# Patient Record
Sex: Female | Born: 1945 | Race: White | Hispanic: No | Marital: Married | Smoking: Former smoker
Health system: Southern US, Community
[De-identification: ages and names within clinical notes are randomized; demographics above are authoritative.]

## PROBLEM LIST (undated history)

## (undated) DIAGNOSIS — E079 Disorder of thyroid, unspecified: Secondary | ICD-10-CM

## (undated) DIAGNOSIS — I10 Essential (primary) hypertension: Secondary | ICD-10-CM

## (undated) DIAGNOSIS — E876 Hypokalemia: Secondary | ICD-10-CM

## (undated) DIAGNOSIS — C801 Malignant (primary) neoplasm, unspecified: Secondary | ICD-10-CM

## (undated) HISTORY — PX: HIP SURGERY: SHX245

## (undated) HISTORY — PX: CATARACT EXTRACTION: SUR2

---

## 2005-06-11 ENCOUNTER — Emergency Department: Payer: Self-pay | Admitting: Emergency Medicine

## 2005-06-14 ENCOUNTER — Emergency Department: Payer: Self-pay | Admitting: Emergency Medicine

## 2007-02-15 ENCOUNTER — Emergency Department: Payer: Self-pay | Admitting: Emergency Medicine

## 2011-01-16 ENCOUNTER — Emergency Department: Payer: Self-pay | Admitting: *Deleted

## 2013-03-21 ENCOUNTER — Emergency Department: Payer: Self-pay | Admitting: Emergency Medicine

## 2014-01-16 ENCOUNTER — Emergency Department: Payer: Self-pay | Admitting: Emergency Medicine

## 2016-08-07 ENCOUNTER — Emergency Department: Payer: Medicare HMO

## 2016-08-07 ENCOUNTER — Emergency Department
Admission: EM | Admit: 2016-08-07 | Discharge: 2016-08-07 | Disposition: A | Discharge status: Home / Self Care | Payer: Medicare HMO | Attending: Emergency Medicine | Admitting: Emergency Medicine

## 2016-08-07 DIAGNOSIS — Z853 Personal history of malignant neoplasm of breast: Secondary | ICD-10-CM | POA: Insufficient documentation

## 2016-08-07 DIAGNOSIS — R519 Headache, unspecified: Secondary | ICD-10-CM

## 2016-08-07 DIAGNOSIS — Z87891 Personal history of nicotine dependence: Secondary | ICD-10-CM | POA: Insufficient documentation

## 2016-08-07 DIAGNOSIS — R51 Headache: Secondary | ICD-10-CM | POA: Insufficient documentation

## 2016-08-07 DIAGNOSIS — I10 Essential (primary) hypertension: Secondary | ICD-10-CM | POA: Insufficient documentation

## 2016-08-07 DIAGNOSIS — H5712 Ocular pain, left eye: Secondary | ICD-10-CM | POA: Insufficient documentation

## 2016-08-07 DIAGNOSIS — J3489 Other specified disorders of nose and nasal sinuses: Secondary | ICD-10-CM

## 2016-08-07 HISTORY — DX: Malignant (primary) neoplasm, unspecified: C80.1

## 2016-08-07 HISTORY — DX: Hypokalemia: E87.6

## 2016-08-07 HISTORY — DX: Essential (primary) hypertension: I10

## 2016-08-07 HISTORY — DX: Disorder of thyroid, unspecified: E07.9

## 2016-08-07 LAB — CBC
HEMATOCRIT: 34.9 % — AB (ref 35.0–47.0)
HEMOGLOBIN: 12.1 g/dL (ref 12.0–16.0)
MCH: 29.7 pg (ref 26.0–34.0)
MCHC: 34.7 g/dL (ref 32.0–36.0)
MCV: 85.7 fL (ref 80.0–100.0)
Platelets: 290 10*3/uL (ref 150–440)
RBC: 4.08 MIL/uL (ref 3.80–5.20)
RDW: 12.8 % (ref 11.5–14.5)
WBC: 12.1 10*3/uL — ABNORMAL HIGH (ref 3.6–11.0)

## 2016-08-07 LAB — BASIC METABOLIC PANEL WITH GFR
Anion gap: 10 (ref 5–15)
BUN: 8 mg/dL (ref 6–20)
CO2: 26 mmol/L (ref 22–32)
Calcium: 8.8 mg/dL — ABNORMAL LOW (ref 8.9–10.3)
Chloride: 95 mmol/L — ABNORMAL LOW (ref 101–111)
Creatinine, Ser: 0.65 mg/dL (ref 0.44–1.00)
GFR calc Af Amer: >60 mL/min
GFR calc non Af Amer: >60 mL/min
Glucose, Bld: 151 mg/dL — ABNORMAL HIGH (ref 65–99)
Potassium: 3.3 mmol/L — ABNORMAL LOW (ref 3.5–5.1)
Sodium: 131 mmol/L — ABNORMAL LOW (ref 135–145)

## 2016-08-07 NOTE — ED Triage Notes (Signed)
Pt states she had cataract surgery of the left eye 2/5 and since the day of surgery shs has had left sided HA  With pain radiating into the neck and jaw.

## 2016-08-07 NOTE — Discharge Instructions (Signed)
You have been seen in the Emergency Department (ED) for a headache.  Please use Tylenol or Motrin as needed for symptoms, but only as written on the box.   As we have discussed, please follow up with your primary care doctor as soon as possible regarding today?s Emergency Department (ED) visit and your headache symptoms.  We also encourage you to go to your eye doctor at the next available opportunity for follow up.  Call your doctor or return to the ED if you have a worsening headache, sudden and severe headache, confusion, slurred speech, facial droop, weakness or numbness in any arm or leg, extreme fatigue, vision problems, or other symptoms that concern you.

## 2016-08-07 NOTE — ED Provider Notes (Signed)
[facility] Emergency Department Provider Note  ____________________________________________   First MD Initiated Contact with Patient 08/07/16 1614     (approximate)  I have reviewed the triage vital signs and the nursing notes.   HISTORY  Chief Complaint Headache    HPI Cindy Moss is a 71 y.o. female who presents with a persistent left-sided headache and bilateral frontal facial pain and cheek pain.  It started after she had L eye (cataract) surgery about 3 weeks ago.  She says the pain has been persistent although it is feeling better now.  It used to involve the back of her head and now it only involves the front and the left side.  She says that her eyelid looks much better than it did before.  She stopped using some of the drops she was given by her ophthalmologist because she wondered if maybe that was making it worse.  She statesthat she has been to 3 or 4 other doctors including her PCP since the surgery but no one can tell her why her head is hurting.  She says she has trouble sleeping at night because the pain but nothing in particular makes it better nor worse.  She denies nausea, vomiting, any acute visual changes, neck pain, difficulty swallowing, difficulty breathing, chest pain, abdominal pain.  He describes the pain as a dull throbbing in the front of her face, around her left eye, and the side of her head.   Past Medical History:  Diagnosis Date  . Cancer (HCC)    breast  . Hypertension   . Hypokalemia   . Thyroid disease     There are no active problems to display for this patient.   Past Surgical History:  Procedure Laterality Date  . CATARACT EXTRACTION Left   . HIP SURGERY Left     Prior to Admission medications   Not on File    Allergies Amlodipine; Desipramine; Flavoxate; Influenza vaccines; Metronidazole; Penicillins; Xanax [alprazolam]; and Ace inhibitors  No family history on file.  Social History Social  History  Substance Use Topics  . Smoking status: Former Research scientist (life sciences)  . Smokeless tobacco: Never Used  . Alcohol use No    Review of Systems Constitutional: No fever/chills Eyes: No acute visual changes. ENT: No sore throat. Cardiovascular: Denies chest pain. Respiratory: Denies shortness of breath. Gastrointestinal: No abdominal pain.  No nausea, no vomiting.  No diarrhea.  No constipation. Genitourinary: Negative for dysuria. Musculoskeletal: Negative for back pain. Skin: Negative for rash. Neurological: Negative for headaches, focal weakness or numbness.  10-point ROS otherwise negative.  ____________________________________________   PHYSICAL EXAM:  VITAL SIGNS: ED Triage Vitals  Enc Vitals Group     BP 08/07/16 1449 (!) 151/72     Pulse Rate 08/07/16 1449 (!) 102     Resp 08/07/16 1449 18     Temp 08/07/16 1449 98.8 F (37.1 C)     Temp Source 08/07/16 1449 Oral     SpO2 08/07/16 1449 99 %     Weight 08/07/16 1450 119 lb (54 kg)     Height 08/07/16 1450 5\' 4"  (1.626 m)     Head Circumference --      Peak Flow --      Pain Score 08/07/16 1503 7     Pain Loc --      Pain Edu? --      Excl. in [place]? --     Constitutional: Alert and oriented. Well appearing and in no  acute distress. Eyes: Left eye gazes to the side and up at baseline, but there is no evidence of any acute infection, trauma, nor other acute abnormality.  She has no periorbital tenderness to palpation and no swelling nor erythema. Head: Atraumatic.  No temporal tenderness to palpation.  She has some mild tenderness to palpation of her frontal sinuses bilaterally and her maxillary sinuses bilaterally. Nose: No congestion/rhinnorhea. Mouth/Throat: Mucous membranes are moist. Neck: No stridor.  No meningeal signs.   Cardiovascular: Normal rate, regular rhythm. Good peripheral circulation. Grossly normal heart sounds. Respiratory: Normal respiratory effort.  No retractions. Lungs CTAB. Gastrointestinal: Soft  and nontender. No distention.  Musculoskeletal: No lower extremity tenderness nor edema. No gross deformities of extremities. Neurologic:  Normal speech and language. No gross focal neurologic deficits are appreciated.  Skin:  Skin is warm, dry and intact. No rash noted. Psychiatric: Mood and affect are normal. Speech and behavior are normal.  ____________________________________________   LABS (all labs ordered are listed, but only abnormal results are displayed)  Labs Reviewed  BASIC METABOLIC PANEL - Abnormal; Notable for the following:       Result Value   Sodium 131 (*)    Potassium 3.3 (*)    Chloride 95 (*)    Glucose, Bld 151 (*)    Calcium 8.8 (*)    All other components within normal limits  CBC - Abnormal; Notable for the following:    WBC 12.1 (*)    HCT 34.9 (*)    All other components within normal limits   ____________________________________________  EKG  None - EKG not ordered by ED physician ____________________________________________  RADIOLOGY   Ct Maxillofacial Wo Contrast  Result Date: 08/07/2016 CLINICAL DATA:  Recent cataract surgery. Patient complains sinus pain and left eye pain. EXAM: CT MAXILLOFACIAL WITHOUT CONTRAST TECHNIQUE: Multidetector CT imaging of the maxillofacial structures was performed. Multiplanar CT image reconstructions were also generated. A small metallic BB was placed on the right temple in order to reliably differentiate right from left. COMPARISON:  None. FINDINGS: Osseous: Mandible is intact. Mandibular condyles are located. The patient is edentulous. No fracture. Orbits: Negative. No traumatic or inflammatory finding. Sinuses: Tiny polyp or mucosal disease in the right maxillary sinus. Mucosal disease in the left posterior ethmoid air cells. Soft tissues: No significant soft tissue swelling. No significant lymphadenopathy in the upper neck or facial regions. Limited intracranial: No significant or unexpected finding. IMPRESSION:  No acute abnormality in the face. Minimal sinus disease. Electronically Signed   By: Markus Daft M.D.   On: 08/07/2016 16:55    ____________________________________________   PROCEDURES  Procedure(s) performed:   Procedures   Critical Care performed: No ____________________________________________   INITIAL IMPRESSION / ASSESSMENT AND PLAN / ED COURSE  Pertinent labs & imaging results that were available during my care of the patient were reviewed by me and considered in my medical decision making (see chart for details).  The patient has an odd affect but is in no acute distress.  I do not see any signs or symptoms of acute infection.  Temporal arteritis is extremely unlikely given that she has no temporal tenderness to palpation.  She does have some tenderness to palpation of her frontal maxillary sinuses.  There is no evidence of preseptal nor orbital cellulitis nor any other infectious process.  I will evaluate with a noncontrast maxillofacial CT scan (CT Sinuses) filled for any acute abnormality, but I explained to the patient that it is most likely she will need  to follow up with her eye doctor.  She understands and agrees with this plan.      ____________________________________________  FINAL CLINICAL IMPRESSION(S) / ED DIAGNOSES  Final diagnoses:  Sinus pain  Left eye pain  Nonintractable headache, unspecified chronicity pattern, unspecified headache type     MEDICATIONS GIVEN DURING THIS VISIT:  Medications - No data to display   NEW OUTPATIENT MEDICATIONS STARTED DURING THIS VISIT:  There are no discharge medications for this patient.   There are no discharge medications for this patient.   There are no discharge medications for this patient.    Note:  This document was prepared using Dragon voice recognition software and may include unintentional dictation errors.    Hinda Kehr, MD 08/07/16 2003

## 2016-08-07 NOTE — ED Notes (Addendum)
Pt c/o bilateral h/a that radiates to neck "a few days" after L eye surgery.  Pt sts that she has seen her PCP and 4 other doctors for same, but "they weren't any good". Pt sts that she was prescribed 3 different eye drops after surgery, but stopped using one after one of her doctors said she was having and allergic reaction.  Pt c/o inability to sleep and lack of appetite r/t pain.  Pt alert, ambulatory w/o issue, resp even and unlabored.  PERRLA

## 2016-08-07 NOTE — ED Notes (Signed)
Patient transported to CT 

## 2016-08-07 NOTE — ED Notes (Signed)
Headache x2 weeks, eye surgery x3 weeks

## 2016-09-13 DIAGNOSIS — Z9181 History of falling: Secondary | ICD-10-CM | POA: Diagnosis not present

## 2016-09-13 DIAGNOSIS — H6122 Impacted cerumen, left ear: Secondary | ICD-10-CM | POA: Diagnosis not present

## 2016-09-13 DIAGNOSIS — R4189 Other symptoms and signs involving cognitive functions and awareness: Secondary | ICD-10-CM | POA: Diagnosis not present

## 2016-09-13 DIAGNOSIS — Z636 Dependent relative needing care at home: Secondary | ICD-10-CM | POA: Diagnosis not present

## 2016-09-13 DIAGNOSIS — E038 Other specified hypothyroidism: Secondary | ICD-10-CM | POA: Diagnosis not present

## 2016-09-13 DIAGNOSIS — K589 Irritable bowel syndrome without diarrhea: Secondary | ICD-10-CM | POA: Diagnosis not present

## 2016-09-13 DIAGNOSIS — E538 Deficiency of other specified B group vitamins: Secondary | ICD-10-CM | POA: Diagnosis not present

## 2016-09-13 DIAGNOSIS — R8299 Other abnormal findings in urine: Secondary | ICD-10-CM | POA: Diagnosis not present

## 2016-09-13 DIAGNOSIS — I10 Essential (primary) hypertension: Secondary | ICD-10-CM | POA: Diagnosis not present

## 2016-09-18 DIAGNOSIS — C50411 Malignant neoplasm of upper-outer quadrant of right female breast: Secondary | ICD-10-CM | POA: Diagnosis not present

## 2016-09-18 DIAGNOSIS — R928 Other abnormal and inconclusive findings on diagnostic imaging of breast: Secondary | ICD-10-CM | POA: Diagnosis not present

## 2016-09-18 DIAGNOSIS — Z170 Estrogen receptor positive status [ER+]: Secondary | ICD-10-CM | POA: Diagnosis not present

## 2016-09-18 DIAGNOSIS — R922 Inconclusive mammogram: Secondary | ICD-10-CM | POA: Diagnosis not present

## 2016-09-28 DIAGNOSIS — Z961 Presence of intraocular lens: Secondary | ICD-10-CM | POA: Diagnosis not present

## 2017-09-30 IMAGING — CT CT MAXILLOFACIAL W/O CM
3 series · 16 of 47 positions shown, 19 images · non-contrast
Comparison: None.

CLINICAL DATA: Recent cataract surgery. Patient complains sinus
pain and left eye pain.

EXAM:
CT MAXILLOFACIAL WITHOUT CONTRAST
TECHNIQUE: Multidetector CT imaging of the maxillofacial structures was
performed. Multiplanar CT image reconstructions were also generated.
A small metallic BB was placed on the right temple in order to
reliably differentiate right from left.

[Series 2: max soft · axial · 0.29mm/px · z∈[-261,-131]mm · 10 of 73 slices shown, 13 images]
[im 5/73  brain]
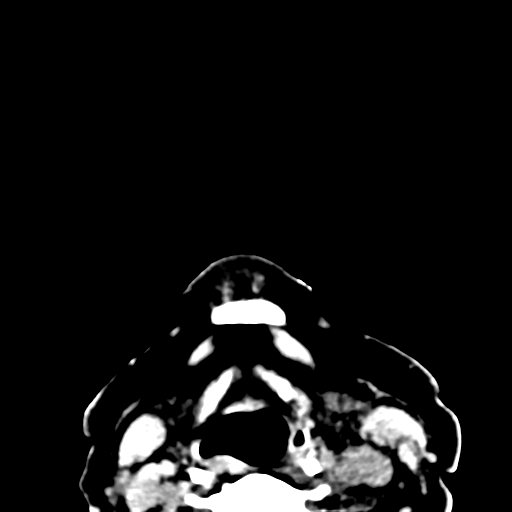
[im 5/73  bone]
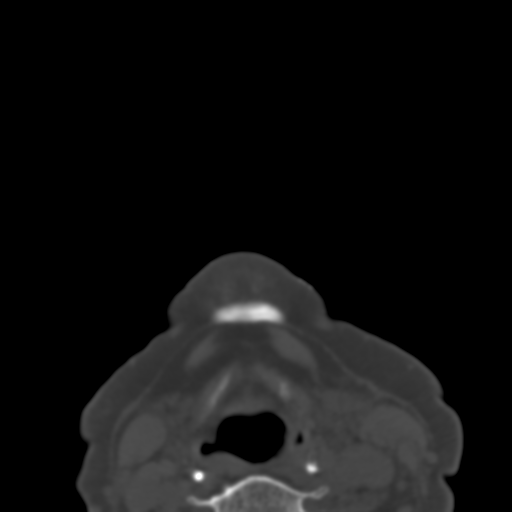
[im 13/73  bone]
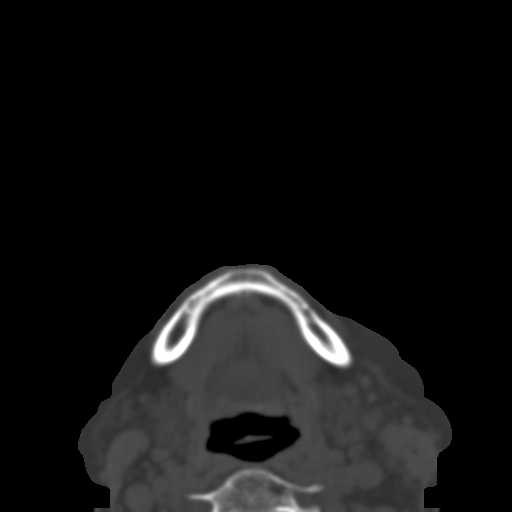
[im 20/73  bone]
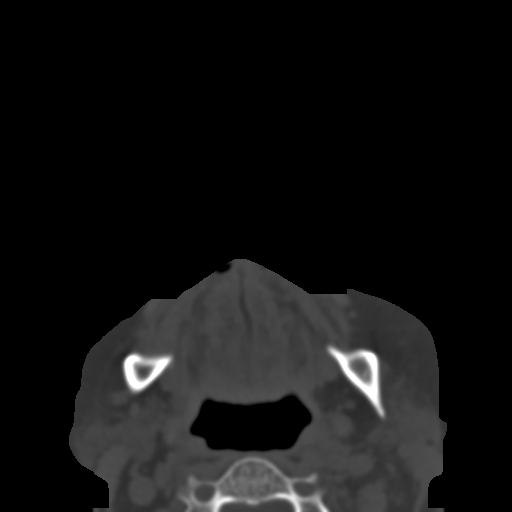
[im 25/73  bone]
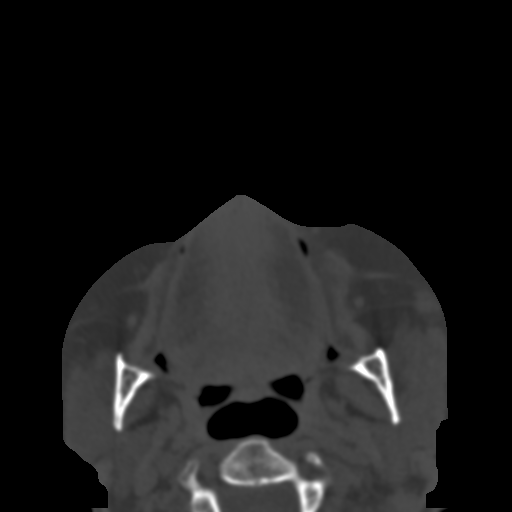
[im 33/73  brain]
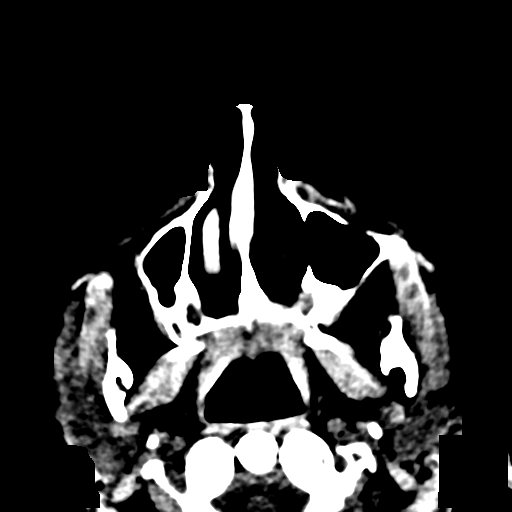
[im 33/73  bone]
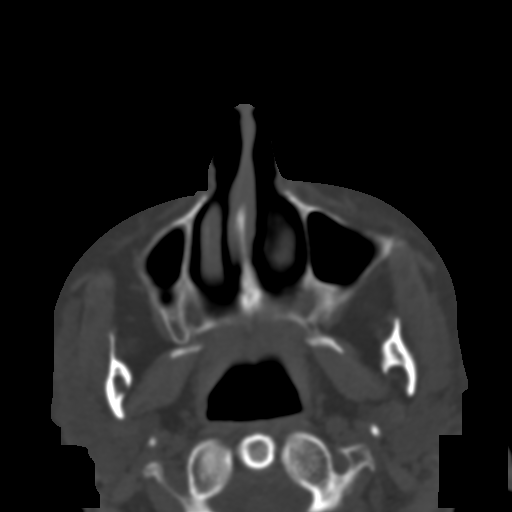
[im 40/73  bone]
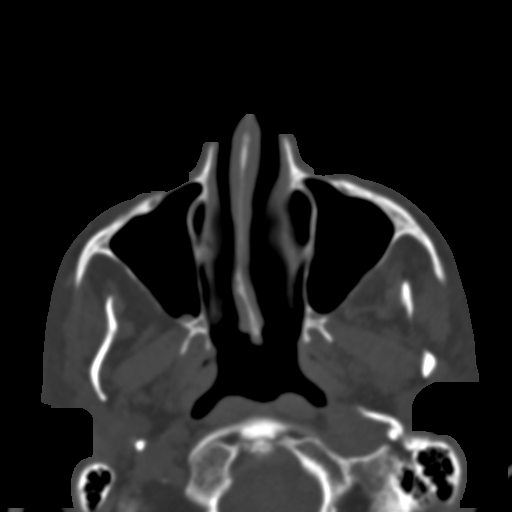
[im 48/73  bone]
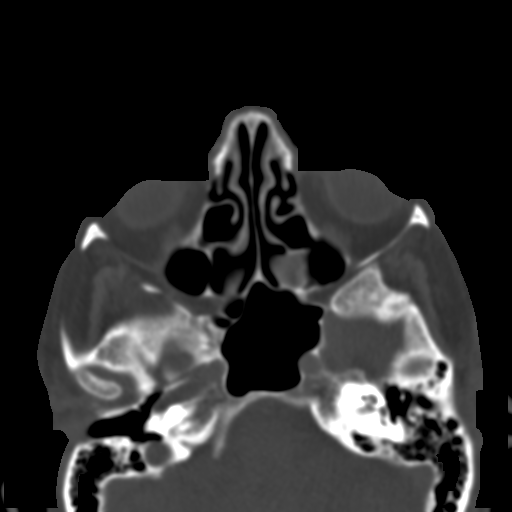
[im 55/73  bone]
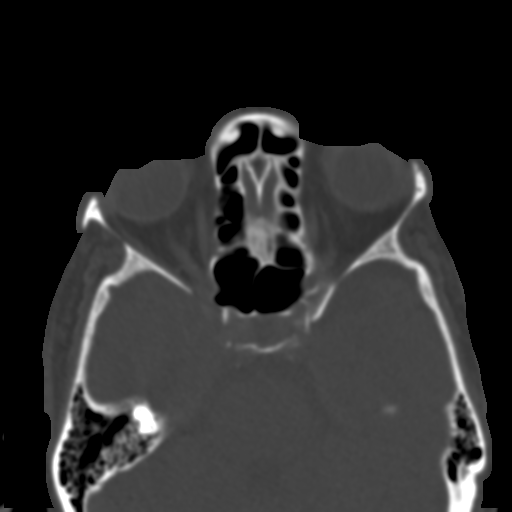
[im 60/73  brain]
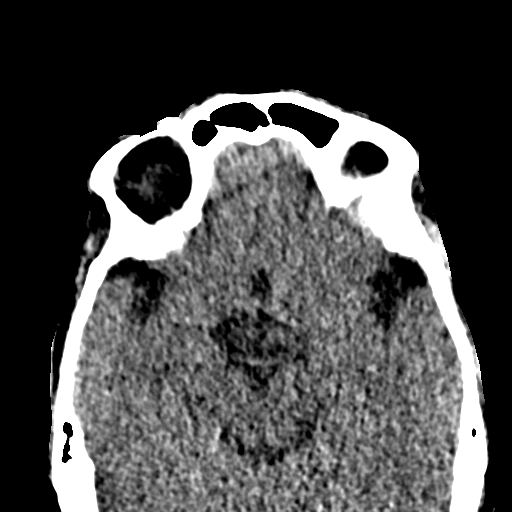
[im 60/73  bone]
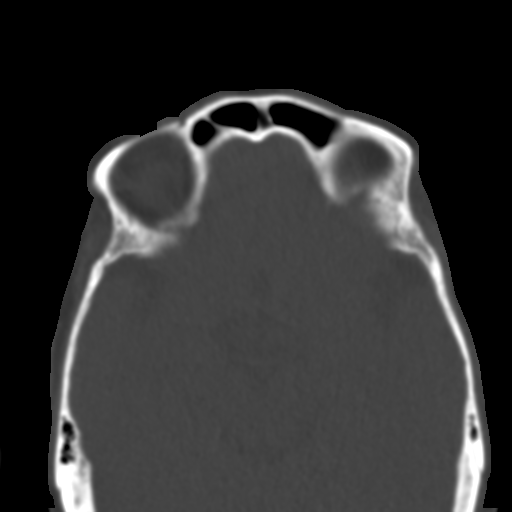
[im 68/73  bone]
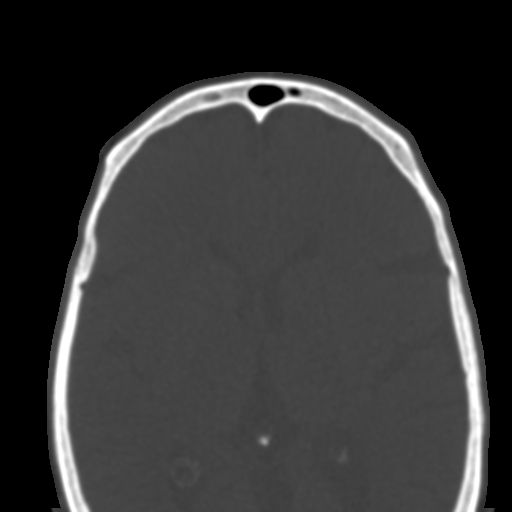

[Series 6: coronal soft · coronal · 0.31mm/px · 3 of 77 slices shown]
[im 34/77  bone]
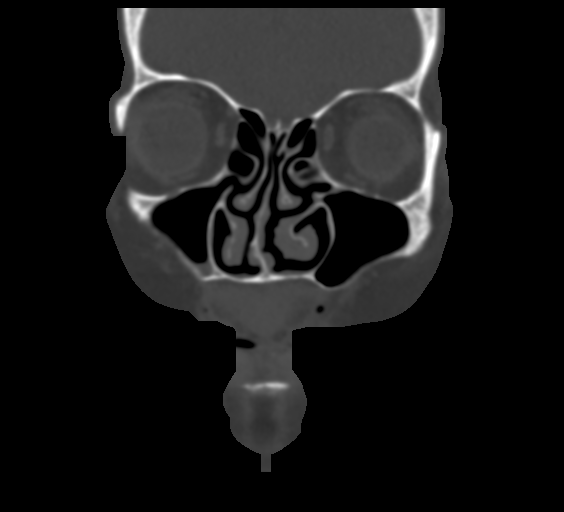
[im 43/77  bone]
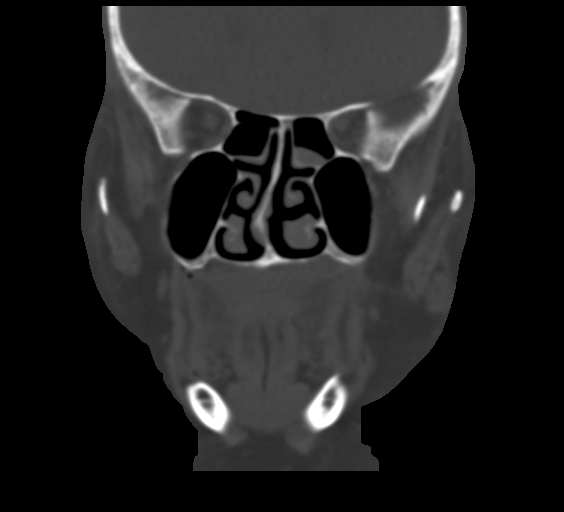
[im 51/77  bone]
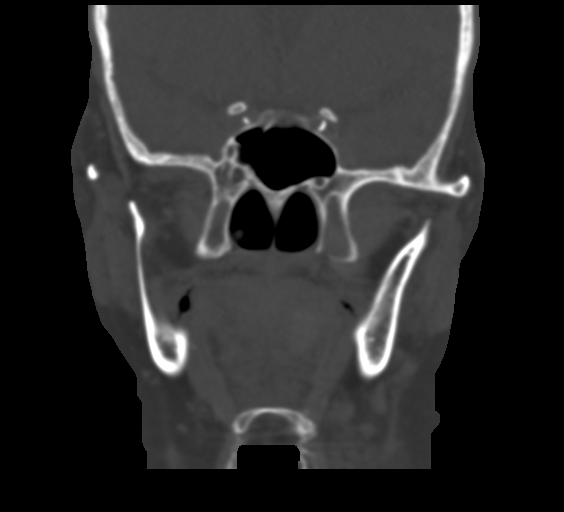

[Series 7: sagittal soft · sagittal · 0.29mm/px · 3 of 79 slices shown]
[im 27/79  bone]
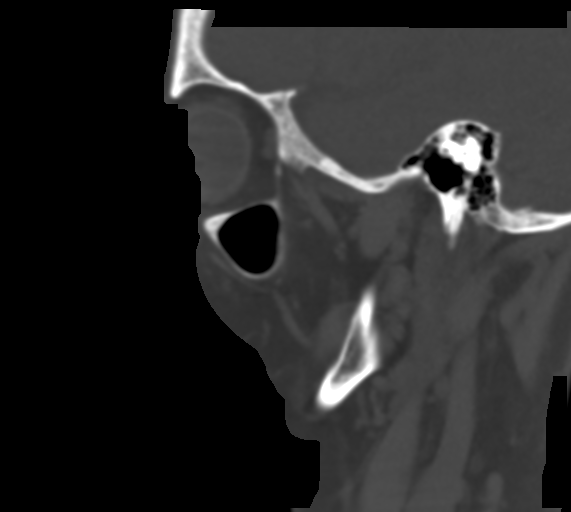
[im 40/79  bone]
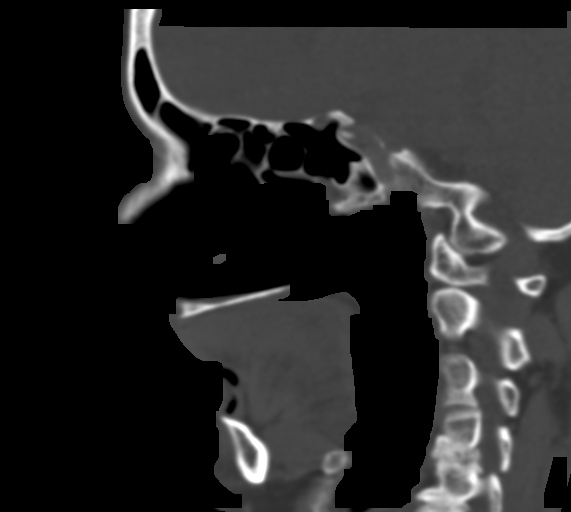
[im 53/79  bone]
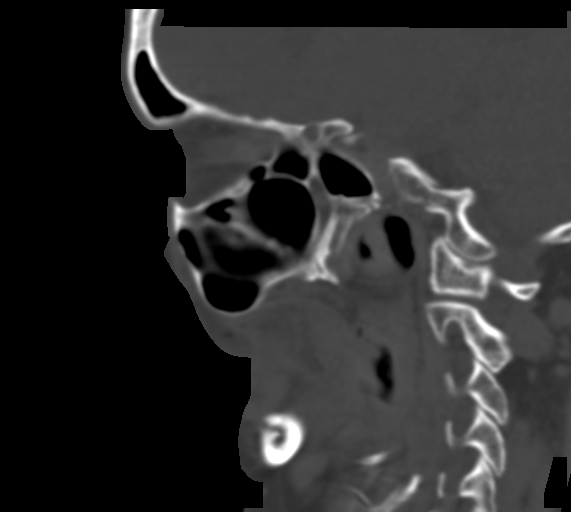

[16 of 47 positions shown; findings below may reference images not displayed]

FINDINGS: Osseous: Mandible is intact. Mandibular condyles are located. The
patient is edentulous. No fracture.

Orbits: Negative. No traumatic or inflammatory finding.

Sinuses: Tiny polyp or mucosal disease in the right maxillary sinus.
Mucosal disease in the left posterior ethmoid air cells.

Soft tissues: No significant soft tissue swelling. No significant
lymphadenopathy in the upper neck or facial regions.

Limited intracranial: No significant or unexpected finding.
IMPRESSION: No acute abnormality in the face.

Minimal sinus disease.

## 2019-09-05 ENCOUNTER — Ambulatory Visit: Payer: Medicare HMO | Attending: Internal Medicine

## 2019-09-05 DIAGNOSIS — Z23 Encounter for immunization: Secondary | ICD-10-CM

## 2019-09-05 NOTE — Progress Notes (Signed)
   Covid-19 Vaccination Clinic  Name:  [name]    MRN: [id number] DOB: Jun 09, 1946  09/05/2019  [name] was observed post Covid-19 immunization for 15 minutes without incident. She was provided with Vaccine Information Sheet and instruction to access the V-Safe system.   [name] was instructed to call 911 with any severe reactions post vaccine: Marland Kitchen Difficulty breathing  . Swelling of face and throat  . A fast heartbeat  . A bad rash all over body  . Dizziness and weakness   Immunizations Administered    Name Date Dose VIS Date Route   Pfizer COVID-19 Vaccine 09/05/2019 10:27 AM 0.3 mL 05/23/2019 Intramuscular   Manufacturer: [place]   Lot: [id number]   [place]: [id number]

## 2019-09-30 ENCOUNTER — Ambulatory Visit: Payer: Medicare HMO

## 2019-10-08 ENCOUNTER — Ambulatory Visit: Payer: Medicare HMO | Attending: Internal Medicine

## 2019-10-08 DIAGNOSIS — Z23 Encounter for immunization: Secondary | ICD-10-CM

## 2019-10-08 NOTE — Progress Notes (Signed)
   Covid-19 Vaccination Clinic  Name:  Cindy Moss    MRN: [id number] DOB: 06/13/1945  10/08/2019  Ms. Cindy Moss was observed post Covid-19 immunization for 15 minutes without incident. She was provided with Vaccine Information Sheet and instruction to access the V-Safe system.   [name] was instructed to call 911 with any severe reactions post vaccine: Marland Kitchen Difficulty breathing  . Swelling of face and throat  . A fast heartbeat  . A bad rash all over body  . Dizziness and weakness   Immunizations Administered    Name Date Dose VIS Date Route   Pfizer COVID-19 Vaccine 10/08/2019 11:55 AM 0.3 mL 08/06/2018 Intramuscular   Manufacturer: [place]   Lot: [id number]   [place]: [id number]

## 2022-08-01 ENCOUNTER — Other Ambulatory Visit (HOSPITAL_COMMUNITY)
Admission: RE | Admit: 2022-08-01 | Discharge: 2022-08-01 | Disposition: A | Discharge status: Home / Self Care | Payer: Medicare HMO | Attending: Obstetrics and Gynecology | Admitting: Obstetrics and Gynecology

## 2022-08-01 ENCOUNTER — Encounter: Payer: Self-pay | Admitting: Obstetrics and Gynecology

## 2022-08-01 ENCOUNTER — Ambulatory Visit (INDEPENDENT_AMBULATORY_CARE_PROVIDER_SITE_OTHER): Payer: Medicare HMO | Admitting: Obstetrics and Gynecology

## 2022-08-01 VITALS — BP 131/66 | HR 76 | Ht 61.5 in | Wt 112.8 lb

## 2022-08-01 DIAGNOSIS — R319 Hematuria, unspecified: Secondary | ICD-10-CM

## 2022-08-01 DIAGNOSIS — N3281 Overactive bladder: Secondary | ICD-10-CM | POA: Diagnosis not present

## 2022-08-01 DIAGNOSIS — N816 Rectocele: Secondary | ICD-10-CM | POA: Insufficient documentation

## 2022-08-01 DIAGNOSIS — R350 Frequency of micturition: Secondary | ICD-10-CM

## 2022-08-01 LAB — POCT URINALYSIS DIPSTICK
Bilirubin, UA: NEGATIVE
Glucose, UA: NEGATIVE
Ketones, UA: NEGATIVE
Leukocytes, UA: NEGATIVE
Nitrite, UA: NEGATIVE
Protein, UA: NEGATIVE
Spec Grav, UA: 1.025 (ref 1.010–1.025)
Urobilinogen, UA: 2 E.U./dL — AB
pH, UA: 6.5 (ref 5.0–8.0)

## 2022-08-01 LAB — URINALYSIS, ROUTINE W REFLEX MICROSCOPIC
Bilirubin Urine: NEGATIVE
Glucose, UA: NEGATIVE mg/dL
Ketones, ur: NEGATIVE mg/dL
Leukocytes,Ua: NEGATIVE
Nitrite: NEGATIVE
Protein, ur: NEGATIVE mg/dL
Specific Gravity, Urine: 1.016 (ref 1.005–1.030)
pH: 6 (ref 5.0–8.0)

## 2022-08-01 NOTE — Progress Notes (Signed)
[facility] New Patient Evaluation and Consultation  Referring Provider: Dianne Dun, MD PCP: Patient, No Pcp Per Date of Service: 08/01/2022  SUBJECTIVE Chief Complaint: New Patient (Initial Visit) Cindy Moss is a 77 y.o. female is here for vaginal swelling)  History of Present Illness: Cindy Moss is a 77 y.o. White or Caucasian female seen in consultation at the request of Dr. Posey Pronto for evaluation of rectocele  Review of records significant for: Has had vaginal swelling and pressure since her colonoscopy last summer. No vaginal pain or bleeding present.   Has history of breast cancer in 2015 with disease recurrence in 2022.   Urinary Symptoms: Leaks urine with cough/ sneeze and without sensation Leaks Few times time(s) per night.  Pad use: none She is bothered by her UI symptoms.  Day time voids 6-7.  Nocturia: 5-6 times per night to void. Voiding dysfunction: she empties her bladder well.  does not use a catheter to empty bladder.  When urinating, she feels a weak stream Drinks: 1 cupCoffee, drinks coke with snacks and lunch , water, sprite in the evening. Denies alcohol  Drinks up until bedtime.   UTIs: 4 UTI's in the last year.   Denies history of blood in urine and kidney or bladder stones  Pelvic Organ Prolapse Symptoms:                  She Admits to a feeling of a bulge the vaginal area.   Denies seeing a bulge.  This bulge is not bothersome.  Bowel Symptom: Bowel movements: 4 time(s) per week Stool consistency: hard or loose Straining: yes, sometimes Splinting: yes.  Incomplete evacuation: yes.  She Denies accidental bowel leakage / fecal incontinence Bowel regimen: miralax   Sexual Function Sexually active: no.    Pelvic Pain Denies pelvic pain   Past Medical History:  Past Medical History:  Diagnosis Date   Cancer ([place])    breast   Hypertension    Hypokalemia    Thyroid disease      Past Surgical History:   Past  Surgical History:  Procedure Laterality Date   CATARACT EXTRACTION Left    HIP SURGERY Left      Past OB/GYN History: OB History     Gravida  2   Para  2   Term  2   Preterm      AB      Living  1      SAB      IAB      Ectopic      Multiple      Live Births           Obstetric Comments  Son passed away at age 69         Vaginal deliveries: 2,  Forceps/ Vacuum deliveries: 0, Cesarean section: 0 Menopausal: Yes, Denies vaginal bleeding since menopause Last pap smear was unknown.  Any history of abnormal pap smears: no.   Medications: She has a current medication list which includes the following prescription(s): alendronate, aspirin ec, clonidine, exemestane, losartan, polyethylene glycol powder, and synthroid.   Allergies: Patient is allergic to amlodipine, desipramine, flavoxate, influenza vaccines, metronidazole, penicillins, xanax [alprazolam], and ace inhibitors.   Social History:  Social History   Tobacco Use   Smoking status: Former    Packs/day: 1.00    Types: Cigarettes   Smokeless tobacco: Never  Vaping Use   Vaping Use: Never used  Substance Use Topics   Alcohol  use: Never   Drug use: Never    Relationship status: married She lives with Husband.   She is not employed. Regular exercise: Yes: exercise machines History of abuse: No  Family History:   Family History  Problem Relation Age of Onset   Kidney disease Mother    Heart attack Brother      Review of Systems: Review of Systems  Constitutional:  Negative for fever, malaise/fatigue and weight loss.  Respiratory:  Positive for cough. Negative for shortness of breath and wheezing.   Cardiovascular:  Positive for chest pain. Negative for palpitations and leg swelling.  Gastrointestinal:  Negative for abdominal pain and blood in stool.  Genitourinary:  Negative for dysuria.  Musculoskeletal:  Negative for myalgias.  Skin:  Negative for rash.  Neurological:  Positive for  headaches. Negative for dizziness.  Endo/Heme/Allergies:  Does not bruise/bleed easily.  Psychiatric/Behavioral:  Negative for depression. The patient is not nervous/anxious.      OBJECTIVE Physical Exam: Vitals:   08/01/22 1007  BP: 131/66  Pulse: 76  Weight: 112 lb 12.8 oz (51.2 kg)  Height: 5' 1.5" (1.562 m)    Physical Exam Exam conducted with a chaperone present.  Constitutional:      General: She is not in acute distress. Pulmonary:     Effort: Pulmonary effort is normal.  Abdominal:     General: There is no distension.     Palpations: Abdomen is soft.     Tenderness: There is no abdominal tenderness. There is no rebound.  Genitourinary:    Urethra: No prolapse or urethral swelling.     Comments: +Rectocele Musculoskeletal:        General: No swelling. Normal range of motion.  Skin:    General: Skin is warm and dry.     Findings: No rash.  Neurological:     Mental Status: She is alert and oriented to person, place, and time.  Psychiatric:        Mood and Affect: Mood normal.        Behavior: Behavior normal.      GU / Detailed Urogynecologic Evaluation:  Pelvic Exam: Normal external female genitalia; Bartholin's and Skene's glands normal in appearance; urethral meatus normal in appearance, no urethral masses or discharge.   CST: negative  Speculum exam reveals normal vaginal mucosa with atrophy. Cervix normal appearance. Uterus normal single, nontender. Adnexa no mass, fullness, tenderness.     Pelvic floor strength II/V, puborectalis III/V external anal sphincter IV/V  Pelvic floor musculature: Right levator non-tender, Right obturator non-tender, Left levator non-tender, Left obturator non-tender  POP-Q:   POP-Q  -2                                            Aa   -2                                           Ba  -5                                              C   4  Gh  3                                             Pb  7.5                                            tvl   0.5                                            Ap  0.5                                            Bp  -5                                              D      Rectal Exam:  Normal sphincter tone, small distal rectocele, enterocoele not present, no rectal masses, no sign of dyssynergia when asking the patient to bear down.  Post-Void Residual (PVR) by Bladder Scan: In order to evaluate bladder emptying, we discussed obtaining a postvoid residual and she agreed to this procedure.  Procedure: The ultrasound unit was placed on the patient's abdomen in the suprapubic region after the patient had voided. A PVR of 24 ml was obtained by bladder scan.  Laboratory Results: POC urine: moderate blood   ASSESSMENT AND PLAN Ms. Southerland is a 77 y.o. with:  1. Posterior vaginal wall prolapse   2. OAB (overactive bladder)   3. Urinary frequency   4. Hematuria, unspecified type    Stage I anterior, Stage II posterior, Stage I apical prolapse - Models and pictures were used to discuss anatomy of the pelvic floor and explain what prolapse is. We discussed that surgical options are available as well as pessaries and expectant management. Patient reports she would like to discuss things further with her PCP who she has a good history with.  - She endorsed feeling overwhelmed with doctors appointments and elects not to do any treatment at this time but will call if she would like a pessary or other treatment options. Handouts provided for her to review.   OAB - We discussed the symptoms of overactive bladder (OAB), which include urinary urgency, urinary frequency, nocturia, with or without urge incontinence.  While we do not know the exact etiology of OAB, several treatment options exist. We discussed management including behavioral therapy (decreasing bladder irritants, urge suppression strategies, timed voids, bladder retraining),  physical therapy, medication.  - She  is not currently interested in medications. Will try to avoid drinking 2-3 hours prior to bedtime.   Blood in urine - Moderate blood in POC urine. Will send for micro UA and culture as patient reports she had a UTI last month  Return as needed  Jaquita Folds, MD

## 2022-08-01 NOTE — Patient Instructions (Addendum)
Today we talked about ways to manage bladder urgency such as altering your diet to avoid irritative beverages and foods (bladder diet) as well as attempting to decrease stress and other exacerbating factors.  Stop drinking 2-3 hours prior to bedtime.   The Most Bothersome Foods* The Least Bothersome Foods*  Coffee - Regular & Decaf Tea - caffeinated Carbonated beverages - cola, non-colas, diet & caffeine-free Alcohols - Beer, Red Wine, White Wine, Champagne Fruits - Grapefruit, [place], Orange, [employer] - Cranberry, Grapefruit, Orange, Pineapple Vegetables - Tomato & Tomato Products Flavor Enhancers - Hot peppers, Spicy foods, Chili, Horseradish, Vinegar, Monosodium glutamate (MSG) Artificial Sweeteners - NutraSweet, Sweet 'N Low, Equal (sweetener), Saccharin Ethnic foods - [place], [place], [place] food Express Scripts - low-fat & whole Fruits - Bananas, Blueberries, Honeydew melon, Pears, Raisins, Watermelon Vegetables - Broccoli, Brussels Sprouts, [place], Carrots, Cauliflower, [place], Cucumber, Mushrooms, Peas, Radishes, Squash, Zucchini, White potatoes, Sweet potatoes & yams Poultry - Chicken, Eggs, [place], [employer] - Beef, Programmer, multimedia, Lamb Seafood - Shrimp, Cedar fish, Salmon Grains - Oat, Rice Snacks - Pretzels, Popcorn  *Lissa Morales et al. Diet and its role in interstitial cystitis/bladder pain syndrome (IC/BPS) and comorbid conditions. [place] 2012 Jan 11.    You have a stage 2 (out of 4) prolapse.  We discussed the fact that it is not life threatening but there are several treatment options. For treatment of pelvic organ prolapse, we discussed options for management including expectant management, conservative management, and surgical management, such as Kegels, a pessary, pelvic floor physical therapy, and specific surgical procedures.

## 2022-08-02 LAB — URINE CULTURE: Culture: NO GROWTH

## 2022-08-15 ENCOUNTER — Other Ambulatory Visit: Payer: Self-pay

## 2022-08-15 ENCOUNTER — Emergency Department: Payer: Medicare HMO

## 2022-08-15 ENCOUNTER — Encounter: Payer: Self-pay | Admitting: Internal Medicine

## 2022-08-15 ENCOUNTER — Inpatient Hospital Stay
Admission: EM | Admit: 2022-08-15 | Discharge: 2022-08-18 | DRG: 522 | Disposition: A | Discharge status: Home Health | Payer: Medicare HMO | Attending: Internal Medicine | Admitting: Internal Medicine

## 2022-08-15 DIAGNOSIS — W010XXA Fall on same level from slipping, tripping and stumbling without subsequent striking against object, initial encounter: Secondary | ICD-10-CM | POA: Diagnosis present

## 2022-08-15 DIAGNOSIS — Z841 Family history of disorders of kidney and ureter: Secondary | ICD-10-CM

## 2022-08-15 DIAGNOSIS — W19XXXA Unspecified fall, initial encounter: Secondary | ICD-10-CM

## 2022-08-15 DIAGNOSIS — Z87891 Personal history of nicotine dependence: Secondary | ICD-10-CM

## 2022-08-15 DIAGNOSIS — E039 Hypothyroidism, unspecified: Secondary | ICD-10-CM | POA: Diagnosis present

## 2022-08-15 DIAGNOSIS — Z8249 Family history of ischemic heart disease and other diseases of the circulatory system: Secondary | ICD-10-CM

## 2022-08-15 DIAGNOSIS — Z880 Allergy status to penicillin: Secondary | ICD-10-CM

## 2022-08-15 DIAGNOSIS — Z79811 Long term (current) use of aromatase inhibitors: Secondary | ICD-10-CM | POA: Diagnosis not present

## 2022-08-15 DIAGNOSIS — I10 Essential (primary) hypertension: Secondary | ICD-10-CM | POA: Diagnosis present

## 2022-08-15 DIAGNOSIS — Z7983 Long term (current) use of bisphosphonates: Secondary | ICD-10-CM

## 2022-08-15 DIAGNOSIS — Z79890 Hormone replacement therapy (postmenopausal): Secondary | ICD-10-CM | POA: Diagnosis not present

## 2022-08-15 DIAGNOSIS — S7291XA Unspecified fracture of right femur, initial encounter for closed fracture: Secondary | ICD-10-CM | POA: Diagnosis present

## 2022-08-15 DIAGNOSIS — Z887 Allergy status to serum and vaccine status: Secondary | ICD-10-CM

## 2022-08-15 DIAGNOSIS — Z7982 Long term (current) use of aspirin: Secondary | ICD-10-CM

## 2022-08-15 DIAGNOSIS — S72001A Fracture of unspecified part of neck of right femur, initial encounter for closed fracture: Secondary | ICD-10-CM | POA: Diagnosis present

## 2022-08-15 DIAGNOSIS — C50911 Malignant neoplasm of unspecified site of right female breast: Secondary | ICD-10-CM | POA: Diagnosis present

## 2022-08-15 DIAGNOSIS — Y92019 Unspecified place in single-family (private) house as the place of occurrence of the external cause: Secondary | ICD-10-CM | POA: Diagnosis not present

## 2022-08-15 DIAGNOSIS — Z888 Allergy status to other drugs, medicaments and biological substances status: Secondary | ICD-10-CM | POA: Diagnosis not present

## 2022-08-15 DIAGNOSIS — Z853 Personal history of malignant neoplasm of breast: Secondary | ICD-10-CM

## 2022-08-15 DIAGNOSIS — Z833 Family history of diabetes mellitus: Secondary | ICD-10-CM

## 2022-08-15 DIAGNOSIS — Z79899 Other long term (current) drug therapy: Secondary | ICD-10-CM

## 2022-08-15 DIAGNOSIS — F419 Anxiety disorder, unspecified: Secondary | ICD-10-CM | POA: Diagnosis present

## 2022-08-15 DIAGNOSIS — Y9301 Activity, walking, marching and hiking: Secondary | ICD-10-CM | POA: Diagnosis present

## 2022-08-15 LAB — CBC WITH DIFFERENTIAL/PLATELET
Abs Immature Granulocytes: 0.06 10*3/uL (ref 0.00–0.07)
Basophils Absolute: 0 10*3/uL (ref 0.0–0.1)
Basophils Relative: 1 %
Eosinophils Absolute: 0 10*3/uL (ref 0.0–0.5)
Eosinophils Relative: 0 %
HCT: 38.1 % (ref 36.0–46.0)
Hemoglobin: 12 g/dL (ref 12.0–15.0)
Immature Granulocytes: 1 %
Lymphocytes Relative: 11 %
Lymphs Abs: 1 10*3/uL (ref 0.7–4.0)
MCH: 28.3 pg (ref 26.0–34.0)
MCHC: 31.5 g/dL (ref 30.0–36.0)
MCV: 89.9 fL (ref 80.0–100.0)
Monocytes Absolute: 0.5 10*3/uL (ref 0.1–1.0)
Monocytes Relative: 6 %
Neutro Abs: 7.3 10*3/uL (ref 1.7–7.7)
Neutrophils Relative %: 81 %
Platelets: 189 10*3/uL (ref 150–400)
RBC: 4.24 MIL/uL (ref 3.87–5.11)
RDW: 13.7 % (ref 11.5–15.5)
WBC: 8.9 10*3/uL (ref 4.0–10.5)
nRBC: 0 % (ref 0.0–0.2)

## 2022-08-15 LAB — SURGICAL PCR SCREEN
MRSA, PCR: NEGATIVE
Staphylococcus aureus: NEGATIVE

## 2022-08-15 LAB — BASIC METABOLIC PANEL
Anion gap: 8 (ref 5–15)
BUN: 15 mg/dL (ref 8–23)
CO2: 24 mmol/L (ref 22–32)
Calcium: 9.6 mg/dL (ref 8.9–10.3)
Chloride: 108 mmol/L (ref 98–111)
Creatinine, Ser: 0.67 mg/dL (ref 0.44–1.00)
GFR, Estimated: >60 mL/min (ref >60)
Glucose, Bld: 111 mg/dL — ABNORMAL HIGH (ref 70–99)
Potassium: 4.3 mmol/L (ref 3.5–5.1)
Sodium: 140 mmol/L (ref 135–145)

## 2022-08-15 MED ORDER — MORPHINE SULFATE (PF) 2 MG/ML IV SOLN
1.0000 mg | INTRAVENOUS | Status: DC | PRN
  Administered 2022-08-15: 1 mg via INTRAVENOUS

## 2022-08-15 MED ORDER — MORPHINE SULFATE (PF) 2 MG/ML IV SOLN
INTRAVENOUS | Status: AC
  Filled 2022-08-15: qty 1

## 2022-08-15 MED ORDER — LOSARTAN POTASSIUM 50 MG PO TABS
50.0000 mg | ORAL_TABLET | Freq: Every day | ORAL | Status: DC
  Administered 2022-08-17 – 2022-08-18 (×2): 50 mg via ORAL
  Filled 2022-08-15 (×2): qty 1

## 2022-08-15 MED ORDER — EXEMESTANE 25 MG PO TABS
25.0000 mg | ORAL_TABLET | Freq: Every day | ORAL | Status: DC
  Administered 2022-08-15 – 2022-08-18 (×3): 25 mg via ORAL
  Filled 2022-08-15 (×4): qty 1

## 2022-08-15 MED ORDER — LEVOTHYROXINE SODIUM 50 MCG PO TABS
75.0000 ug | ORAL_TABLET | Freq: Every day | ORAL | Status: DC
  Administered 2022-08-17 – 2022-08-18 (×2): 75 ug via ORAL
  Filled 2022-08-15 (×2): qty 1

## 2022-08-15 MED ORDER — POLYETHYLENE GLYCOL 3350 17 G PO PACK
17.0000 g | PACK | Freq: Every day | ORAL | Status: DC
  Administered 2022-08-17 – 2022-08-18 (×2): 17 g via ORAL
  Filled 2022-08-15 (×2): qty 1

## 2022-08-15 MED ORDER — HYDROCODONE-ACETAMINOPHEN 5-325 MG PO TABS
1.0000 | ORAL_TABLET | Freq: Four times a day (QID) | ORAL | Status: AC | PRN
  Administered 2022-08-15 – 2022-08-16 (×3): 1 via ORAL
  Filled 2022-08-15 (×4): qty 1

## 2022-08-15 MED ORDER — MORPHINE SULFATE (PF) 4 MG/ML IV SOLN
4.0000 mg | Freq: Once | INTRAVENOUS | Status: AC
  Administered 2022-08-15: 4 mg via INTRAVENOUS
  Filled 2022-08-15: qty 1

## 2022-08-15 MED ORDER — MORPHINE SULFATE (PF) 2 MG/ML IV SOLN
2.0000 mg | INTRAVENOUS | Status: AC | PRN
  Administered 2022-08-16: 2 mg via INTRAVENOUS
  Filled 2022-08-15: qty 1

## 2022-08-15 MED ORDER — CLONIDINE HCL 0.1 MG PO TABS
0.2000 mg | ORAL_TABLET | Freq: Every day | ORAL | Status: DC
  Administered 2022-08-17 – 2022-08-18 (×2): 0.2 mg via ORAL
  Filled 2022-08-15 (×2): qty 2

## 2022-08-15 MED ORDER — CEFAZOLIN SODIUM-DEXTROSE 2-4 GM/100ML-% IV SOLN: 2.0000 g | INTRAVENOUS | Status: DC

## 2022-08-15 MED ORDER — HYDROCODONE-ACETAMINOPHEN 5-325 MG PO TABS: 1.0000 | ORAL_TABLET | Freq: Four times a day (QID) | ORAL | Status: DC | PRN

## 2022-08-15 MED ORDER — SENNOSIDES-DOCUSATE SODIUM 8.6-50 MG PO TABS
1.0000 | ORAL_TABLET | Freq: Every evening | ORAL | Status: DC | PRN
  Administered 2022-08-17: 1 via ORAL
  Filled 2022-08-15: qty 1

## 2022-08-15 NOTE — Assessment & Plan Note (Addendum)
Mgmt per Orthopedic surgery, Dr. Roland Rack Plan for surgery today. --Post-op PT evaluation -- HH PT recommended --Pain control PRN - pt has had minimal pain post-op --Bowel regimen --DVT ppx - full dose ASA x 2 weeks -- Follow up with Ortho in 10-14 days for xray and staple removal -- WBAT on RLE

## 2022-08-15 NOTE — ED Provider Notes (Signed)
[facility] Provider Note    Event Date/Time   First MD Initiated Contact with Patient 08/15/22 1142     (approximate)   History   Chief Complaint Fall   HPI  Cindy Moss is a 77 y.o. female with past medical history of hypertension, hypothyroidism, and breast cancer who presents to the ED following fall.  Patient reports that just prior to arrival she was walking in her home when she "got tripped up" and fell onto her right hip.  She denies hitting her head or losing consciousness, does not take a blood thinner.  She reports significant pain if she tries to move her right hip, has been unable to bear weight since the fall.  She denies any headache, neck pain, chest pain, abdominal pain, or upper extremity pain.  EMS reports shortening and external rotation of the right lower extremity.     Physical Exam   Triage Vital Signs: ED Triage Vitals  Enc Vitals Group     BP --      Pulse --      Resp --      Temp --      Temp src --      SpO2 --      Weight 08/15/22 1147 112 lb 10.5 oz (51.1 kg)     Height 08/15/22 1147 '5\' 1"'$  (1.549 m)     Head Circumference --      Peak Flow --      Pain Score 08/15/22 1146 0     Pain Loc --      Pain Edu? --      Excl. in [place]? --     Most recent vital signs: Vitals:   08/15/22 1230 08/15/22 1330  BP: (!) 153/70 139/61  Pulse: 81 73  Resp: 14 16  Temp:    SpO2: 97% 93%    Constitutional: Alert and oriented. Eyes: Conjunctivae are normal. Head: Atraumatic. Nose: No congestion/rhinnorhea. Mouth/Throat: Mucous membranes are moist.  Neck: No midline cervical spine tenderness to palpation. Cardiovascular: Normal rate, regular rhythm. Grossly normal heart sounds.  2+ radial and DP pulses bilaterally. Respiratory: Normal respiratory effort.  No retractions. Lungs CTAB. Gastrointestinal: Soft and nontender. No distention. Musculoskeletal: Diffuse tenderness to palpation at right hip with shortening and  external rotation of the right lower extremity.  No tenderness to palpation at left hip, bilateral knees or ankles.  Range of motion intact throughout right ankle and foot. Neurologic:  Normal speech and language. No gross focal neurologic deficits are appreciated.    ED Results / Procedures / Treatments   Labs (all labs ordered are listed, but only abnormal results are displayed) Labs Reviewed  BASIC METABOLIC PANEL - Abnormal; Notable for the following components:      Result Value   Glucose, Bld 111 (*)    All other components within normal limits  CBC WITH DIFFERENTIAL/PLATELET     EKG  ED ECG REPORT I, Blake Divine, the attending physician, personally viewed and interpreted this ECG.   Date: 08/15/2022  EKG Time: 12:48  Rate: 72  Rhythm: normal sinus rhythm  Axis: LAD  Intervals:none  ST&T Change: None  RADIOLOGY Right hip x-ray reviewed and interpreted by me with right femoral neck fracture, no dislocation noted.  Chest x-ray reviewed and interpreted by me with no infiltrate, edema, or effusion noted.  PROCEDURES:  Critical Care performed: No  Procedures   MEDICATIONS ORDERED IN ED: Medications  morphine (PF) 4 MG/ML  injection 4 mg (4 mg Intravenous Given 08/15/22 1156)     IMPRESSION / MDM / ASSESSMENT AND PLAN / ED COURSE  I reviewed the triage vital signs and the nursing notes.                              77 y.o. female with past medical history of hypertension, breast cancer, and hypothyroidism who presents to the ED complaining of right hip pain after tripping and falling earlier this morning, denies hitting her head or losing consciousness.  Patient's presentation is most consistent with acute complicated illness / injury requiring diagnostic workup.  Differential diagnosis includes, but is not limited to, hip fracture, hip dislocation, hip contusion, intracranial injury, cervical spine injury.  Patient well-appearing and in no acute distress,  vital signs are unremarkable and she is neurovascular intact to her distal right lower extremity.  She does have apparent deformity of her right hip and we will further assess with x-ray.  Plan to treat with IV morphine and reassess, no evidence of traumatic injury to her head, neck, trunk, or upper extremities.  Right hip x-ray is consistent with femoral neck fracture, chest x-ray performed and unremarkable.  Labs are also unremarkable with no significant anemia, leukocytosis, lecture abnormality, or AKI.  Patient's pain improved following IV morphine.  Case discussed with Dr. Roland Rack of orthopedics, who is currently operating and will take a look once he is done to determine schedule for surgery.  Case discussed with hospitalist for admission.      FINAL CLINICAL IMPRESSION(S) / ED DIAGNOSES   Final diagnoses:  Closed fracture of right hip, initial encounter [facility])  Fall, initial encounter     Rx / DC Orders   ED Discharge Orders     None        Note:  This document was prepared using Dragon voice recognition software and may include unintentional dictation errors.   Blake Divine, MD 08/15/22 [phone]

## 2022-08-15 NOTE — H&P (Addendum)
History and Physical   Cindy Moss [id number] DOB: 1946/03/25 DOA: 08/15/2022  PCP: Dianne Dun, MD  Outpatient Specialists: Dr. Wyline Mood, [facility] dermatology and skin cancer Patient coming from: Home  I have personally briefly reviewed patient's old medical records in [place].  Chief Concern: Fall  HPI: Ms. Cindy Moss is a 77 year old female with hypertension, hypothyroid, anxiety, history of breast cancer on Aromasin, who presents emergency department for chief concerns of a fall.  Vitals in the ED showed temperature of 97.9, respiration rate 18, heart rate 70, blood pressure 142/62, SpO2 of 94% on room air.  Serum sodium is 140, potassium 4.3, chloride 108, bicarb 24, BUN of 15, serum creatinine 0.67, eGFR greater than 60, nonfasting blood glucose 111, WBC 8.9, hemoglobin 12.0, platelets of 189.  UA was negative for nitrates and leukocytes.  ED treatment: Morphine 4 mg IV one-time dose.  EDP consulted orthopedic service, Dr. Roland Rack who states he will see the patient. ------------------------- At bedside, she is able to tell me her name, age,current location, current year.  She was picking up her dog gate between the living room and dining room when she slipped and fell into the dining room. She states her living is carpeted and dining room is not carpeted.   She endorses pain on her right side. She endorses some swelling.   She denies chest pain, shortness of breath, abdominal pain, dysuria, hematuria, diarrhea, nausea, vomiting. She  reports the right hip pain is not so bad right now.   Social history: She lives with her husband. She is a former tobacco user, quitting when she was very young. She denies etoh and recreational drug use. She is retired.  ROS: Constitutional: no weight change, no fever ENT/Mouth: no sore throat, no rhinorrhea Eyes: no eye pain, no vision changes Cardiovascular: no chest pain, no dyspnea,  no edema, no palpitations Respiratory:  no cough, no sputum, no wheezing Gastrointestinal: no nausea, no vomiting, no diarrhea, no constipation Genitourinary: no urinary incontinence, no dysuria, no hematuria Musculoskeletal: no arthralgias, no myalgias, + right hip pain Skin: no skin lesions, no pruritus, Neuro: + weakness, no loss of consciousness, no syncope Psych: no anxiety, no depression, no decrease appetite Heme/Lymph: no bruising, no bleeding  ED Course: Discussed with emergency medicine provider, patient requiring hospitalization for chief concerns of proximal right femur fracture with displacement near right femoral neck.  Assessment/Plan  Principal Problem:   Right femoral fracture (HCC) Active Problems:   Essential hypertension   Hypothyroidism   History of right breast cancer   Assessment and Plan:  * Right femoral fracture ([place]) - Orthopedic service has been consulted by EDP, Dr. 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   DVT prophylaxis: TED hose; AM team to initiate pharmacologic DVT prophylaxis when the benefits outweigh the risk Code Status: full code Diet: heart healthy; npo after midnight Family Communication: a phone call was offered, patient declined, stating that her husband knows she's in the hospital  already Disposition Plan: Pending orthopedic evaluation Consults called: Orthopedic Admission status: Inpatient, MedSurg  Past Medical  History:  Diagnosis Date   Cancer ([place])    breast   Hypertension    Hypokalemia    Thyroid disease    Past Surgical History:  Procedure Laterality Date   CATARACT EXTRACTION Left    HIP SURGERY Left    Social History:  reports that she has quit smoking. Her smoking use included cigarettes. She smoked an average of 1 pack per day. She has never used smokeless tobacco. She reports that she does not drink alcohol and does not use drugs.  Allergies  Allergen Reactions   Amlodipine Swelling   Desipramine Hives   Flavoxate Itching   Influenza Vaccines Swelling   Metronidazole Hives   Penicillins Hives   Xanax [Alprazolam] Hives   Ace Inhibitors Rash   Family History  Problem Relation Age of Onset   Kidney disease Mother    Heart attack Brother    Family history: Family history reviewed and not pertinent  Prior to Admission medications   Medication Sig Start Date End Date Taking? Authorizing Provider  aspirin EC 81 MG tablet Take 1 tablet by mouth daily.   Yes [provider]  cloNIDine (CATAPRES) 0.2 MG tablet Take by mouth. 10/29/20 11/10/22 Yes [provider]  exemestane (AROMASIN) 25 MG tablet Take 25 mg by mouth daily.   Yes [provider]  losartan (COZAAR) 50 MG tablet Take 1 tablet by mouth daily. 12/06/20  Yes [provider]  polyethylene glycol powder (GLYCOLAX/MIRALAX) 17 GM/SCOOP powder Take by mouth. 06/17/20  Yes [provider]  SYNTHROID 75 MCG tablet Take 75 mcg by mouth daily.   Yes [provider]  alendronate (FOSAMAX) 70 MG tablet Take 70 mg by mouth once a week. 06/07/22 06/07/23  [provider]   Physical Exam: Vitals:   08/15/22 1430 08/15/22 1445 08/15/22 1500 08/15/22 1556  BP: (!) 140/63  (!) 139/56 137/61  Pulse:   72 68  Resp: '19  17 16  '$ Temp:    98.2 F (36.8 C)  TempSrc:      SpO2:  93% 93% 98%  Weight:      Height:       Constitutional: appears age appropriate, NAD, calm,  comfortable Eyes: PERRL, lids and conjunctivae normal ENMT: Mucous membranes are moist. Posterior pharynx clear of any exudate or lesions. Age-appropriate dentition. Hearing appropriate Neck: normal, supple, no masses, no thyromegaly Respiratory: clear to auscultation bilaterally, no wheezing, no crackles. Normal respiratory effort. No accessory muscle use.  Cardiovascular: Regular rate and rhythm, no murmurs / rubs / gallops. No extremity edema. 2+ pedal pulses. No carotid bruits.  Abdomen: no tenderness, no masses palpated, no hepatosplenomegaly. Bowel sounds positive.  Musculoskeletal: no clubbing / cyanosis. No joint deformity upper and lower extremities. Good ROM, no contractures, no atrophy. Normal muscle tone.  Skin: no rashes, lesions, ulcers. No induration Neurologic: Sensation intact. Strength 5/5 in all 4.  Psychiatric: Normal judgment and insight. Alert and oriented x 3. Normal mood.   EKG: ordered by edp, pending completion  Chest x-ray on Admission: I personally reviewed and I agree with radiologist reading as below.  DG Chest 1 View  Result Date: 08/15/2022 CLINICAL DATA:  Fall. EXAM: CHEST  1 VIEW COMPARISON:  01/16/2014 FINDINGS: Coarse lung markings are chronic. No focal airspace disease or lung consolidation. Heart size is slightly enlarged and stable. Atherosclerotic calcifications at the aortic arch. Evidence for scarring  at both lung apices. The trachea is midline. Surgical clip in the right breast region. IMPRESSION: Chronic lung changes without acute findings. Electronically Signed   By: Markus Daft M.D.   On: 08/15/2022 12:34   DG Hip Unilat W or Wo Pelvis 2-3 Views Right  Result Date: 08/15/2022 CLINICAL DATA:  Fall. EXAM: DG HIP (WITH OR WITHOUT PELVIS) 2-3V RIGHT COMPARISON:  03/21/2013 FINDINGS: Displaced fracture of the proximal right femur. Fracture appears to be involving the right femoral neck and probably represents a transcervical femoral neck fracture. Superior  displacement of the right femoral trochanters. Right femoral head is located. Pelvic bony ring is intact. Left hip arthroplasty is partially imaged but appears to be intact. IMPRESSION: Displaced fracture of the proximal right femur. Fracture appears to be involving the right femoral neck. Electronically Signed   By: Markus Daft M.D.   On: 08/15/2022 12:32    Labs on Admission: I have personally reviewed following labs  CBC: Recent Labs  Lab 08/15/22 1156  WBC 8.9  NEUTROABS 7.3  HGB 12.0  HCT 38.1  MCV 89.9  PLT 99991111   Basic Metabolic Panel: Recent Labs  Lab 08/15/22 1156  NA 140  K 4.3  CL 108  CO2 24  GLUCOSE 111*  BUN 15  CREATININE 0.67  CALCIUM 9.6   GFR: Estimated Creatinine Clearance: 45.1 mL/min (by C-G formula based on SCr of 0.67 mg/dL).  Urine analysis:    Component Value Date/Time   COLORURINE YELLOW 08/01/2022 1100   APPEARANCEUR CLEAR 08/01/2022 1100   LABSPEC 1.016 08/01/2022 1100   PHURINE 6.0 08/01/2022 1100   GLUCOSEU NEGATIVE 08/01/2022 1100   HGBUR SMALL (A) 08/01/2022 1100   BILIRUBINUR NEGATIVE 08/01/2022 1100   BILIRUBINUR Negative 08/01/2022 1026   KETONESUR NEGATIVE 08/01/2022 1100   PROTEINUR NEGATIVE 08/01/2022 1100   PROTEINUR Negative 08/01/2022 1026   UROBILINOGEN 2.0 (A) 08/01/2022 1026   NITRITE NEGATIVE 08/01/2022 1100   NITRITE Negative 08/01/2022 1026   LEUKOCYTESUR NEGATIVE 08/01/2022 1100   LEUKOCYTESUR Negative 08/01/2022 1026   This document was prepared using Dragon Voice Recognition software and may include unintentional dictation errors.  Dr. Tobie Poet [facility]  If 7PM-7AM, please contact overnight-coverage provider If 7AM-7PM, please contact day coverage provider [email or web address]  08/15/2022, 6:06 PM

## 2022-08-15 NOTE — Assessment & Plan Note (Signed)
Grade 1 invasive ductal carcinoma - Exemestane 25 mg daily resumed - Continue outpatient follow-up with oncologic specialists

## 2022-08-15 NOTE — Assessment & Plan Note (Signed)
Continue levothyroxine 

## 2022-08-15 NOTE — Hospital Course (Signed)
Ms. Cindy Moss is a 77 year old female with hypertension, hypothyroid, anxiety, history of breast cancer on Aromasin, who presents emergency department for chief concerns of a fall.  Vitals in the ED showed temperature of 97.9, respiration rate 18, heart rate 70, blood pressure 142/62, SpO2 of 94% on room air.  Serum sodium is 140, potassium 4.3, chloride 108, bicarb 24, BUN of 15, serum creatinine 0.67, eGFR greater than 60, nonfasting blood glucose 111, WBC 8.9, hemoglobin 12.0, platelets of 189.  UA was negative for nitrates and leukocytes.  ED treatment: Morphine 4 mg IV one-time dose.  EDP consulted orthopedic service, Dr. Roland Rack who states he will see the patient.

## 2022-08-15 NOTE — Consult Note (Signed)
ORTHOPAEDIC CONSULTATION  REQUESTING PHYSICIAN: Cox, Amy N, DO  Chief Complaint:   Right hip pain  History of Present Illness: Cindy Moss is a 77 y.o. female with a history of breast cancer, hypertension, and hypothyroidism who lives independently with her husband.  The patient apparently was trying to step over a gate in her house designed to keep the dogs out of her living room when she tripped and fell onto her right side.  Her husband tried to get her up but she was unable to bear weight on her right leg so she was brought to the emergency room where x-rays demonstrated a displaced right femoral neck fracture.  The patient denies any associated injuries.  She did not strike her head or lose consciousness.  The patient also denies any lightheadedness, dizziness, chest pain, shortness of breath, or other symptoms which may have precipitated her fall.  The patient does note that she has "poor balance" and that her primary care provider has been encouraging her to ambulate with a walker or at least a cane, but she was not doing so when she fell this morning.  Past Medical History:  Diagnosis Date   Cancer ([place])    breast   Hypertension    Hypokalemia    Thyroid disease    Past Surgical History:  Procedure Laterality Date   CATARACT EXTRACTION Left    HIP SURGERY Left    Social History   Socioeconomic History   Marital status: Married    Spouse name: Not on file   Number of children: Not on file   Years of education: Not on file   Highest education level: Not on file  Occupational History   Not on file  Tobacco Use   Smoking status: Former    Packs/day: 1.00    Types: Cigarettes   Smokeless tobacco: Never  Vaping Use   Vaping Use: Never used  Substance and Sexual Activity   Alcohol use: Never   Drug use: Never   Sexual activity: Not Currently  Other Topics Concern   Not on file  Social History Narrative    Not on file   Social Determinants of Health   Financial Resource Strain: Not on file  Food Insecurity: No Food Insecurity (08/15/2022)   Hunger Vital Sign    Worried About Running Out of Food in the Last Year: Never true    Ran Out of Food in the Last Year: Never true  Transportation Needs: No Transportation Needs (08/15/2022)   PRAPARE - Hydrologist (Medical): No    Lack of Transportation (Non-Medical): No  Physical Activity: Not on file  Stress: Not on file  Social Connections: Not on file   Family History  Problem Relation Age of Onset   Kidney disease Mother    Heart attack Brother    Diabetes Daughter    Allergies  Allergen Reactions   Amlodipine Swelling   Desipramine Hives   Flavoxate Itching   Influenza Vaccines Swelling   Metronidazole Hives   Penicillins Hives   Xanax [Alprazolam] Hives   Ace Inhibitors Rash   Prior to Admission medications   Medication Sig Start Date End Date Taking? Authorizing Provider  aspirin EC 81 MG tablet Take 1 tablet by mouth daily.   Yes [provider]  cloNIDine (CATAPRES) 0.2 MG tablet Take by mouth. 10/29/20 11/10/22 Yes [provider]  exemestane (AROMASIN) 25 MG tablet Take 25 mg by mouth daily.   Yes  [provider]  losartan (COZAAR) 50 MG tablet Take 1 tablet by mouth daily. 12/06/20  Yes [provider]  polyethylene glycol powder (GLYCOLAX/MIRALAX) 17 GM/SCOOP powder Take by mouth. 06/17/20  Yes [provider]  SYNTHROID 75 MCG tablet Take 75 mcg by mouth daily.   Yes [provider]  alendronate (FOSAMAX) 70 MG tablet Take 70 mg by mouth once a week. 06/07/22 06/07/23  [provider]   DG Chest 1 View  Result Date: 08/15/2022 CLINICAL DATA:  Fall. EXAM: CHEST  1 VIEW COMPARISON:  01/16/2014 FINDINGS: Coarse lung markings are chronic. No focal airspace disease or lung consolidation. Heart size is slightly enlarged and stable.  Atherosclerotic calcifications at the aortic arch. Evidence for scarring at both lung apices. The trachea is midline. Surgical clip in the right breast region. IMPRESSION: Chronic lung changes without acute findings. Electronically Signed   By: Markus Daft M.D.   On: 08/15/2022 12:34   DG Hip Unilat W or Wo Pelvis 2-3 Views Right  Result Date: 08/15/2022 CLINICAL DATA:  Fall. EXAM: DG HIP (WITH OR WITHOUT PELVIS) 2-3V RIGHT COMPARISON:  03/21/2013 FINDINGS: Displaced fracture of the proximal right femur. Fracture appears to be involving the right femoral neck and probably represents a transcervical femoral neck fracture. Superior displacement of the right femoral trochanters. Right femoral head is located. Pelvic bony ring is intact. Left hip arthroplasty is partially imaged but appears to be intact. IMPRESSION: Displaced fracture of the proximal right femur. Fracture appears to be involving the right femoral neck. Electronically Signed   By: Markus Daft M.D.   On: 08/15/2022 12:32    Positive ROS: All other systems have been reviewed and were otherwise negative with the exception of those mentioned in the HPI and as above.  Physical Exam: General:  Alert, no acute distress Psychiatric:  Patient is competent for consent with normal mood and affect   Cardiovascular:  No pedal edema Respiratory:  No wheezing, non-labored breathing GI:  Abdomen is soft and non-tender Skin:  No lesions in the area of chief complaint Neurologic:  Sensation intact distally Lymphatic:  No axillary or cervical lymphadenopathy  Orthopedic Exam:  Orthopedic examination is limited to the right hip and lower extremity.  The right leg is somewhat shortened and externally rotated as compared to the left.  Skin inspection around the right hip is notable for mild swelling as well as a small area of ecchymosis in the posterolateral aspect of the right hip.  No erythema, abrasions, or other skin abnormalities are identified.  She has  mild-moderate tenderness to palpation over the lateral aspect of the right hip.  She has more severe pain with any attempted active or passive motion of the right hip or leg.  She is grossly neurovascularly intact to the right lower extremity and foot.  X-rays:  X-rays of the pelvis and right hip are available for review and have been reviewed by myself.  The findings are consistent with a varus displaced right femoral neck fracture, as described above.  No significant degenerative changes of the right hip are identified.  Assessment: Displaced right femoral neck fracture.  Plan: The treatment options, including both surgical and nonsurgical choices, have been discussed in detail with the patient.  The patient would like to proceed with surgical intervention to include a right hip hemiarthroplasty.  The risks (including bleeding, infection, nerve and/or blood vessel injury, persistent or recurrent pain, loosening or failure of the components, leg length inequality, dislocation, need  for further surgery, blood clots, strokes, heart attacks or arrhythmias, pneumonia, etc.) and benefits of the surgical procedure were discussed.  The patient states his/her understanding and agrees to proceed.  A formal written consent will be obtained by the nursing staff.  Thank you for asking me to participate in the care of this most pleasant and unfortunate woman.  I will be happy to follow her with you.   Pascal Lux, MD  Beeper #:  [phone]  08/15/2022 8:21 PM

## 2022-08-15 NOTE — ED Triage Notes (Addendum)
Pt presents to the ED due to a fall that happened today. Pt denies LOC and blood thinner. Pt lives at home with husband. R leg appears shorten and rotated. Pt A&Ox4

## 2022-08-15 NOTE — Assessment & Plan Note (Addendum)
Continue home clonidine, losartan

## 2022-08-16 ENCOUNTER — Inpatient Hospital Stay: Payer: Medicare HMO

## 2022-08-16 ENCOUNTER — Inpatient Hospital Stay: Payer: Medicare HMO | Admitting: Certified Registered"

## 2022-08-16 ENCOUNTER — Other Ambulatory Visit: Payer: Self-pay

## 2022-08-16 ENCOUNTER — Encounter
Admission: EM | Disposition: A | Discharge status: Home Health | Payer: Self-pay | Source: Home / Self Care | Attending: Internal Medicine

## 2022-08-16 DIAGNOSIS — E039 Hypothyroidism, unspecified: Secondary | ICD-10-CM

## 2022-08-16 DIAGNOSIS — I10 Essential (primary) hypertension: Secondary | ICD-10-CM

## 2022-08-16 DIAGNOSIS — Z853 Personal history of malignant neoplasm of breast: Secondary | ICD-10-CM

## 2022-08-16 HISTORY — PX: HIP ARTHROPLASTY: SHX981

## 2022-08-16 LAB — CBC
HCT: 37.1 % (ref 36.0–46.0)
Hemoglobin: 11.9 g/dL — ABNORMAL LOW (ref 12.0–15.0)
MCH: 28.1 pg (ref 26.0–34.0)
MCHC: 32.1 g/dL (ref 30.0–36.0)
MCV: 87.7 fL (ref 80.0–100.0)
Platelets: 181 10*3/uL (ref 150–400)
RBC: 4.23 MIL/uL (ref 3.87–5.11)
RDW: 13.7 % (ref 11.5–15.5)
WBC: 8.2 10*3/uL (ref 4.0–10.5)
nRBC: 0 % (ref 0.0–0.2)

## 2022-08-16 LAB — BASIC METABOLIC PANEL
Anion gap: 8 (ref 5–15)
BUN: 18 mg/dL (ref 8–23)
CO2: 23 mmol/L (ref 22–32)
Calcium: 9 mg/dL (ref 8.9–10.3)
Chloride: 108 mmol/L (ref 98–111)
Creatinine, Ser: 0.64 mg/dL (ref 0.44–1.00)
GFR, Estimated: >60 mL/min (ref >60)
Glucose, Bld: 112 mg/dL — ABNORMAL HIGH (ref 70–99)
Potassium: 3.6 mmol/L (ref 3.5–5.1)
Sodium: 139 mmol/L (ref 135–145)

## 2022-08-16 SURGERY — HEMIARTHROPLASTY, HIP, DIRECT ANTERIOR APPROACH, FOR FRACTURE
Anesthesia: General | Site: Hip | Laterality: Right

## 2022-08-16 MED ORDER — METHYLPREDNISOLONE ACETATE 40 MG/ML IJ SUSP
INTRAMUSCULAR | Status: AC
  Filled 2022-08-16: qty 2

## 2022-08-16 MED ORDER — PHENYLEPHRINE HCL-NACL 20-0.9 MG/250ML-% IV SOLN
INTRAVENOUS | Status: AC
  Filled 2022-08-16: qty 250

## 2022-08-16 MED ORDER — DIPHENHYDRAMINE HCL 12.5 MG/5ML PO ELIX: 12.5000 mg | ORAL_SOLUTION | ORAL | Status: DC | PRN

## 2022-08-16 MED ORDER — ONDANSETRON HCL 4 MG/2ML IJ SOLN
INTRAMUSCULAR | Status: DC | PRN
  Administered 2022-08-16: 4 mg via INTRAVENOUS

## 2022-08-16 MED ORDER — HYDROCODONE-ACETAMINOPHEN 7.5-325 MG PO TABS
1.0000 | ORAL_TABLET | ORAL | Status: DC | PRN
  Administered 2022-08-17: 1 via ORAL
  Filled 2022-08-16: qty 1

## 2022-08-16 MED ORDER — CEFAZOLIN SODIUM-DEXTROSE 2-4 GM/100ML-% IV SOLN
INTRAVENOUS | Status: AC
  Filled 2022-08-16: qty 100

## 2022-08-16 MED ORDER — ONDANSETRON HCL 4 MG PO TABS
4.0000 mg | ORAL_TABLET | Freq: Four times a day (QID) | ORAL | Status: DC | PRN
  Administered 2022-08-17: 4 mg via ORAL
  Filled 2022-08-16: qty 1

## 2022-08-16 MED ORDER — TRANEXAMIC ACID 1000 MG/10ML IV SOLN
INTRAVENOUS | Status: AC
  Filled 2022-08-16: qty 10

## 2022-08-16 MED ORDER — ACETAMINOPHEN 325 MG PO TABS: 325.0000 mg | ORAL_TABLET | Freq: Four times a day (QID) | ORAL | Status: DC | PRN

## 2022-08-16 MED ORDER — PHENYLEPHRINE HCL-NACL 20-0.9 MG/250ML-% IV SOLN
INTRAVENOUS | Status: DC | PRN
  Administered 2022-08-16: 20 ug/min via INTRAVENOUS

## 2022-08-16 MED ORDER — DIPHENHYDRAMINE HCL 25 MG PO CAPS
25.0000 mg | ORAL_CAPSULE | Freq: Four times a day (QID) | ORAL | Status: DC | PRN
  Administered 2022-08-16 – 2022-08-18 (×3): 25 mg via ORAL
  Filled 2022-08-16 (×3): qty 1

## 2022-08-16 MED ORDER — BUPIVACAINE HCL (PF) 0.5 % IJ SOLN
INTRAMUSCULAR | Status: AC
  Filled 2022-08-16: qty 30

## 2022-08-16 MED ORDER — LIDOCAINE HCL (CARDIAC) PF 100 MG/5ML IV SOSY
PREFILLED_SYRINGE | INTRAVENOUS | Status: DC | PRN
  Administered 2022-08-16: 40 mg via INTRAVENOUS

## 2022-08-16 MED ORDER — BUPIVACAINE LIPOSOME 1.3 % IJ SUSP
INTRAMUSCULAR | Status: AC
  Filled 2022-08-16: qty 20

## 2022-08-16 MED ORDER — ENOXAPARIN SODIUM 40 MG/0.4ML IJ SOSY
40.0000 mg | PREFILLED_SYRINGE | INTRAMUSCULAR | Status: DC
  Administered 2022-08-17 – 2022-08-18 (×2): 40 mg via SUBCUTANEOUS
  Filled 2022-08-16 (×2): qty 0.4

## 2022-08-16 MED ORDER — ONDANSETRON HCL 4 MG/2ML IJ SOLN: 4.0000 mg | Freq: Once | INTRAMUSCULAR | Status: DC | PRN

## 2022-08-16 MED ORDER — LABETALOL HCL 5 MG/ML IV SOLN
INTRAVENOUS | Status: DC | PRN
  Administered 2022-08-16 (×4): 5 mg via INTRAVENOUS

## 2022-08-16 MED ORDER — ROCURONIUM BROMIDE 100 MG/10ML IV SOLN
INTRAVENOUS | Status: DC | PRN
  Administered 2022-08-16: 10 mg via INTRAVENOUS
  Administered 2022-08-16: 50 mg via INTRAVENOUS

## 2022-08-16 MED ORDER — 0.9 % SODIUM CHLORIDE (POUR BTL) OPTIME
TOPICAL | Status: DC | PRN
  Administered 2022-08-16: 500 mL

## 2022-08-16 MED ORDER — SODIUM CHLORIDE FLUSH 0.9 % IV SOLN
INTRAVENOUS | Status: AC
  Filled 2022-08-16: qty 40

## 2022-08-16 MED ORDER — PROPOFOL 10 MG/ML IV BOLUS
INTRAVENOUS | Status: DC | PRN
  Administered 2022-08-16: 120 mg via INTRAVENOUS
  Administered 2022-08-16: 30 mg via INTRAVENOUS

## 2022-08-16 MED ORDER — FENTANYL CITRATE (PF) 100 MCG/2ML IJ SOLN
INTRAMUSCULAR | Status: AC
  Filled 2022-08-16: qty 2

## 2022-08-16 MED ORDER — EPINEPHRINE PF 1 MG/ML IJ SOLN
INTRAMUSCULAR | Status: AC
  Filled 2022-08-16: qty 1

## 2022-08-16 MED ORDER — DEXMEDETOMIDINE HCL IN NACL 80 MCG/20ML IV SOLN
INTRAVENOUS | Status: DC | PRN
  Administered 2022-08-16 (×6): 4 ug via BUCCAL

## 2022-08-16 MED ORDER — FENTANYL CITRATE (PF) 100 MCG/2ML IJ SOLN: 25.0000 ug | INTRAMUSCULAR | Status: DC | PRN

## 2022-08-16 MED ORDER — TRIAMCINOLONE ACETONIDE 40 MG/ML IJ SUSP
INTRAMUSCULAR | Status: DC | PRN
  Administered 2022-08-16: 63 mL via INTRAMUSCULAR

## 2022-08-16 MED ORDER — MAGNESIUM HYDROXIDE 400 MG/5ML PO SUSP: 30.0000 mL | Freq: Every day | ORAL | Status: DC | PRN

## 2022-08-16 MED ORDER — TRIAMCINOLONE ACETONIDE 40 MG/ML IJ SUSP
INTRAMUSCULAR | Status: AC
  Filled 2022-08-16: qty 2

## 2022-08-16 MED ORDER — SODIUM CHLORIDE 0.9 % IV SOLN: INTRAVENOUS | Status: DC

## 2022-08-16 MED ORDER — METOCLOPRAMIDE HCL 5 MG/ML IJ SOLN: 5.0000 mg | Freq: Three times a day (TID) | INTRAMUSCULAR | Status: DC | PRN

## 2022-08-16 MED ORDER — HYDROMORPHONE HCL 1 MG/ML IJ SOLN
INTRAMUSCULAR | Status: AC
  Filled 2022-08-16: qty 1

## 2022-08-16 MED ORDER — SUGAMMADEX SODIUM 200 MG/2ML IV SOLN
INTRAVENOUS | Status: DC | PRN
  Administered 2022-08-16: 200 mg via INTRAVENOUS

## 2022-08-16 MED ORDER — SODIUM CHLORIDE 0.9 % IR SOLN
Status: DC | PRN
  Administered 2022-08-16 (×2): 3000 mL

## 2022-08-16 MED ORDER — CEFAZOLIN SODIUM-DEXTROSE 2-3 GM-%(50ML) IV SOLR
INTRAVENOUS | Status: DC | PRN
  Administered 2022-08-16: 2 g via INTRAVENOUS

## 2022-08-16 MED ORDER — METOCLOPRAMIDE HCL 5 MG PO TABS: 5.0000 mg | ORAL_TABLET | Freq: Three times a day (TID) | ORAL | Status: DC | PRN

## 2022-08-16 MED ORDER — DEXAMETHASONE SODIUM PHOSPHATE 10 MG/ML IJ SOLN
INTRAMUSCULAR | Status: DC | PRN
  Administered 2022-08-16: 5 mg via INTRAVENOUS

## 2022-08-16 MED ORDER — LACTATED RINGERS IV SOLN: INTRAVENOUS | Status: DC | PRN

## 2022-08-16 MED ORDER — EPHEDRINE SULFATE (PRESSORS) 50 MG/ML IJ SOLN
INTRAMUSCULAR | Status: DC | PRN
  Administered 2022-08-16: 5 mg via INTRAVENOUS

## 2022-08-16 MED ORDER — ONDANSETRON HCL 4 MG/2ML IJ SOLN: 4.0000 mg | Freq: Four times a day (QID) | INTRAMUSCULAR | Status: DC | PRN

## 2022-08-16 MED ORDER — FENTANYL CITRATE (PF) 100 MCG/2ML IJ SOLN
INTRAMUSCULAR | Status: DC | PRN
  Administered 2022-08-16 (×2): 50 ug via INTRAVENOUS

## 2022-08-16 MED ORDER — ACETAMINOPHEN 500 MG PO TABS
500.0000 mg | ORAL_TABLET | Freq: Four times a day (QID) | ORAL | Status: DC
  Administered 2022-08-16 – 2022-08-17 (×3): 500 mg via ORAL
  Filled 2022-08-16 (×3): qty 1

## 2022-08-16 MED ORDER — TRANEXAMIC ACID 1000 MG/10ML IV SOLN
INTRAVENOUS | Status: DC | PRN
  Administered 2022-08-16: 1000 mg

## 2022-08-16 MED ORDER — CEFAZOLIN SODIUM-DEXTROSE 2-4 GM/100ML-% IV SOLN
2.0000 g | Freq: Three times a day (TID) | INTRAVENOUS | Status: AC
  Administered 2022-08-16 – 2022-08-17 (×3): 2 g via INTRAVENOUS
  Filled 2022-08-16 (×3): qty 100

## 2022-08-16 MED ORDER — HYDROMORPHONE HCL 1 MG/ML IJ SOLN
INTRAMUSCULAR | Status: DC | PRN
  Administered 2022-08-16 (×2): .5 mg via INTRAVENOUS

## 2022-08-16 MED ORDER — FLEET ENEMA 7-19 GM/118ML RE ENEM: 1.0000 | ENEMA | Freq: Once | RECTAL | Status: DC | PRN

## 2022-08-16 MED ORDER — BISACODYL 10 MG RE SUPP: 10.0000 mg | Freq: Every day | RECTAL | Status: DC | PRN

## 2022-08-16 MED ORDER — DOCUSATE SODIUM 100 MG PO CAPS
100.0000 mg | ORAL_CAPSULE | Freq: Two times a day (BID) | ORAL | Status: DC
  Administered 2022-08-16 – 2022-08-18 (×4): 100 mg via ORAL
  Filled 2022-08-16 (×4): qty 1

## 2022-08-16 SURGICAL SUPPLY — 74 items
APL PRP STRL LF DISP 70% ISPRP (MISCELLANEOUS) ×2
BAG DECANTER FOR FLEXI CONT (MISCELLANEOUS) IMPLANT
BIT DRILL 2.7 (BIT) ×1
BIT DRILL 2.7X2.7/3XSCR ANKL (BIT) IMPLANT
BIT DRL 2.7X2.7/3XSCR ANKL (BIT) ×1
BLADE SAGITTAL WIDE XTHICK NO (BLADE) ×1 IMPLANT
BLADE SAW SAG 25.4X90 (BLADE) ×1 IMPLANT
BLADE SURG SZ20 CARB STEEL (BLADE) ×1 IMPLANT
BNDG CMPR 5X6 CHSV STRCH STRL (GAUZE/BANDAGES/DRESSINGS)
BNDG COHESIVE 6X5 TAN ST LF (GAUZE/BANDAGES/DRESSINGS) ×1 IMPLANT
BOWL CEMENT MIXING ADV NOZZLE (MISCELLANEOUS) IMPLANT
CHLORAPREP W/TINT 26 (MISCELLANEOUS) ×2 IMPLANT
DRAPE 3/4 80X56 (DRAPES) ×1 IMPLANT
DRAPE IMP U-DRAPE 54X76 (DRAPES) ×2 IMPLANT
DRAPE INCISE IOBAN 66X60 STRL (DRAPES) ×1 IMPLANT
DRAPE SURG 17X11 SM STRL (DRAPES) ×1 IMPLANT
DRAPE SURG 17X23 STRL (DRAPES) ×1 IMPLANT
DRSG OPSITE POSTOP 4X12 (GAUZE/BANDAGES/DRESSINGS) ×1 IMPLANT
DRSG OPSITE POSTOP 4X14 (GAUZE/BANDAGES/DRESSINGS) IMPLANT
DRSG OPSITE POSTOP 4X8 (GAUZE/BANDAGES/DRESSINGS) ×1 IMPLANT
ELECT CAUTERY BLADE 6.4 (BLADE) ×1 IMPLANT
ELECT REM PT RETURN 9FT ADLT (ELECTROSURGICAL) ×1
ELECTRODE REM PT RTRN 9FT ADLT (ELECTROSURGICAL) ×1 IMPLANT
GAUZE 4X4 16PLY ~~LOC~~+RFID DBL (SPONGE) ×1 IMPLANT
GAUZE PACK 2X3YD (PACKING) IMPLANT
GLOVE BIO SURGEON STRL SZ8 (GLOVE) ×3 IMPLANT
GLOVE BIOGEL M STRL SZ7.5 (GLOVE) IMPLANT
GLOVE BIOGEL PI IND STRL 8 (GLOVE) IMPLANT
GLOVE SURG UNDER LTX SZ8 (GLOVE) ×1 IMPLANT
GOWN STRL REUS W/ TWL LRG LVL3 (GOWN DISPOSABLE) ×1 IMPLANT
GOWN STRL REUS W/ TWL XL LVL3 (GOWN DISPOSABLE) ×1 IMPLANT
GOWN STRL REUS W/TWL LRG LVL3 (GOWN DISPOSABLE) ×1
GOWN STRL REUS W/TWL XL LVL3 (GOWN DISPOSABLE) ×1
HEAD ENDO II MOD SZ 45 (Orthopedic Implant) IMPLANT
HOLDER FOLEY CATH W/STRAP (MISCELLANEOUS) IMPLANT
HOLSTER ELECTROSUGICAL PENCIL (MISCELLANEOUS) ×1 IMPLANT
HOOD PEEL AWAY T7 (MISCELLANEOUS) ×1 IMPLANT
INSERT TAPER ENDO II -6 (Orthopedic Implant) IMPLANT
IV NS 100ML SINGLE PACK (IV SOLUTION) IMPLANT
IV NS IRRIG 3000ML ARTHROMATIC (IV SOLUTION) ×2 IMPLANT
KIT SHOWERHEAD PULSAVAC (MISCELLANEOUS) IMPLANT
LABEL OR SOLS (LABEL) ×1 IMPLANT
MANIFOLD NEPTUNE II (INSTRUMENTS) ×1 IMPLANT
NDL FILTER BLUNT 18X1 1/2 (NEEDLE) ×1 IMPLANT
NDL SAFETY ECLIP 18X1.5 (MISCELLANEOUS) ×1 IMPLANT
NDL SPNL 20GX3.5 QUINCKE YW (NEEDLE) ×1 IMPLANT
NEEDLE FILTER BLUNT 18X1 1/2 (NEEDLE) ×1 IMPLANT
NEEDLE SPNL 20GX3.5 QUINCKE YW (NEEDLE) ×1 IMPLANT
NS IRRIG 1000ML POUR BTL (IV SOLUTION) ×1 IMPLANT
NS IRRIG 500ML POUR BTL (IV SOLUTION) IMPLANT
PACK HIP PROSTHESIS (MISCELLANEOUS) ×1 IMPLANT
PULSAVAC PLUS IRRIG FAN TIP (DISPOSABLE)
SPIKE FLUID TRANSFER (MISCELLANEOUS) ×2 IMPLANT
SPONGE T-LAP 18X18 ~~LOC~~+RFID (SPONGE) ×4 IMPLANT
STAPLER SKIN PROX 35W (STAPLE) ×1 IMPLANT
STEM COLLARLESS RED 12X120X130 (Stem) IMPLANT
STRAP SAFETY 5IN WIDE (MISCELLANEOUS) ×1 IMPLANT
SUT TICRON 2-0 30IN 311381 (SUTURE) ×1 IMPLANT
SUT VIC AB 1 CT1 36 (SUTURE) IMPLANT
SUT VIC AB 2-0 CT1 (SUTURE) ×2 IMPLANT
SUT VIC AB 2-0 CT1 27 (SUTURE)
SUT VIC AB 2-0 CT1 TAPERPNT 27 (SUTURE) IMPLANT
SUT VICRYL 1-0 27IN ABS (SUTURE) ×2
SUTURE VICRYL 1-0 27IN ABS (SUTURE) ×2 IMPLANT
SYR 10ML LL (SYRINGE) ×1 IMPLANT
SYR 30ML LL (SYRINGE) ×3 IMPLANT
SYR TB 1ML 27GX1/2 LL (SYRINGE) IMPLANT
TAPE TRANSPORE STRL 2 31045 (GAUZE/BANDAGES/DRESSINGS) ×1 IMPLANT
TIP BRUSH PULSAVAC PLUS 24.33 (MISCELLANEOUS) IMPLANT
TIP FAN IRRIG PULSAVAC PLUS (DISPOSABLE) ×1 IMPLANT
TRAP FLUID SMOKE EVACUATOR (MISCELLANEOUS) ×2 IMPLANT
TRAY FOLEY MTR SLVR 16FR STAT (SET/KITS/TRAYS/PACK) IMPLANT
WATER STERILE IRR 1000ML POUR (IV SOLUTION) IMPLANT
WATER STERILE IRR 500ML POUR (IV SOLUTION) ×1 IMPLANT

## 2022-08-16 NOTE — Anesthesia Preprocedure Evaluation (Signed)
Anesthesia Evaluation  Patient identified by MRN, date of birth, ID band Patient awake    Reviewed: Allergy & Precautions, H&P , NPO status , Patient's Chart, lab work & pertinent test results, reviewed documented beta blocker date and time   Airway Mallampati: II  TM Distance: >3 FB Neck ROM: full    Dental  (+) Teeth Intact   Pulmonary neg pulmonary ROS, former smoker   Pulmonary exam normal        Cardiovascular Exercise Tolerance: Poor hypertension, On Medications negative cardio ROS Normal cardiovascular exam Rhythm:regular Rate:Normal     Neuro/Psych negative neurological ROS  negative psych ROS   GI/Hepatic negative GI ROS, Neg liver ROS,,,  Endo/Other  Hypothyroidism    Renal/GU negative Renal ROS  negative genitourinary   Musculoskeletal   Abdominal   Peds  Hematology negative hematology ROS (+)   Anesthesia Other Findings Past Medical History: No date: Cancer ([place])     Comment:  breast No date: Hypertension No date: Hypokalemia No date: Thyroid disease Past Surgical History: No date: CATARACT EXTRACTION; Left No date: HIP SURGERY; Left BMI    Body Mass Index: 22.11 kg/m     Reproductive/Obstetrics negative OB ROS                             Anesthesia Physical Anesthesia Plan  ASA: 2 and emergent  Anesthesia Plan: General ETT   Post-op Pain Management:    Induction:   PONV Risk Score and Plan: 4 or greater  Airway Management Planned:   Additional Equipment:   Intra-op Plan:   Post-operative Plan:   Informed Consent: I have reviewed the patients History and Physical, chart, labs and discussed the procedure including the risks, benefits and alternatives for the proposed anesthesia with the patient or authorized representative who has indicated his/her understanding and acceptance.     Dental Advisory Given  Plan Discussed with: CRNA  Anesthesia Plan  Comments:        Anesthesia Quick Evaluation

## 2022-08-16 NOTE — Transfer of Care (Signed)
Immediate Anesthesia Transfer of Care Note  Patient: Cindy Moss  Procedure(s) Performed: ARTHROPLASTY BIPOLAR HIP (HEMIARTHROPLASTY) (Right: Hip)  Patient Location: PACU  Anesthesia Type:General  Level of Consciousness: awake  Airway & Oxygen Therapy: Patient Spontanous Breathing  Post-op Assessment: Report given to RN and Post -op Vital signs reviewed and stable  Post vital signs: Reviewed  Last Vitals:  Vitals Value Taken Time  BP 135/70 08/16/22 1715  Temp 37 C 08/16/22 1714  Pulse 75 08/16/22 1717  Resp 14 08/16/22 1717  SpO2 91 % 08/16/22 1717  Vitals shown include unvalidated device data.  Last Pain:  Vitals:   08/16/22 1327  TempSrc: Oral  PainSc: 5          Complications: No notable events documented.

## 2022-08-16 NOTE — Progress Notes (Signed)
  Progress Note   Patient: Cindy Moss [id number] DOB: [birth year] DOA: 08/15/2022     1 DOS: the patient was seen and examined on 08/16/2022   Brief hospital course: Cindy Moss is a 77 year old female with hypertension, hypothyroid, anxiety, history of breast cancer on Aromasin, who presented on 08/15/2022 for evaluation after having a fall with subsequent right hip pain and inability to bear weight.  The fall was purely mechanical in nature when she tried to step over a pet gate.  Evaluation in the ED revealed a varus displaced right femoral neck fracture.  She was admitted to the hospital with orthopedic surgery consulted.  Assessment and Plan: * Right femoral fracture ([place]) Mgmt per Orthopedic surgery, Dr. Roland Rack Plan for surgery today. 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  She woke briefly, denied uncontrolled pain or other complaints.  No acute events reported.  Physical Exam: Vitals:   08/15/22 1556 08/15/22 2257 08/16/22 0734 08/16/22 1327  BP: 137/61 (!) 142/59 (!) 143/63 (!) 143/66  Pulse: 68 (!) 57 73   Resp: '16 15 17 18  '$ Temp: 98.2 F (36.8 C) 97.8 F (36.6 C) 97.9 F (36.6 C) 99.4 F (37.4 C)  TempSrc:    Oral  SpO2: 98% 99% 97% 94%  Weight:    53.1 kg  Height:    '5\' 1"'$  (1.549 m)   General exam: somnolent, repsonsive, no acute distress HEENT: moist mucus membranes, hearing grossly normal  Respiratory system: CTAB, no wheezes, rales or rhonchi, normal respiratory effort. Cardiovascular system: normal S1/S2, RRR, no pedal edema.   Gastrointestinal system: soft, NT, ND, +bowel  sounds. Central nervous system: exam limited by somnolence, grossly non-focal, minimal but normal speech Extremities: no edema, normal tone Skin: dry, intact, normal temperature Psychiatry: exam limited by somnolence  Data Reviewed:  Notable labs ---   Hbg 11.9 otherwise normal CBC.  Glucose 112 otherwise normal BMP  Family Communication: None present, will attempt to call.  Disposition: Status is: Inpatient Remains inpatient appropriate because: surgery planned for today   Planned Discharge Destination: Home with [facility] vs SNF    Time spent: 36 minutes  Author: Ezekiel Slocumb, DO 08/16/2022 2:32 PM  For on call review [email or web address].

## 2022-08-16 NOTE — Op Note (Signed)
08/16/2022  5:11 PM  Patient:   Cindy Moss  Pre-Op Diagnosis:   Displaced femoral neck fracture, right hip.  Post-Op Diagnosis:   Same.  Procedure:   Right hip unipolar hemiarthroplasty.  Surgeon:   Pascal Lux, MD  Assistant:   Dorian Pod, RNFA  Anesthesia:   GET  Findings:   As above.  Complications:   None  EBL:   125 cc  Fluids:   200 cc crystalloid  UOP:   400 cc  TT:   None  Drains:   None  Closure:   Staples  Implants:   Biomet press-fit system with a #12 laterally offset reduced proximal profile Echo femoral stem, a 45 mm outer diameter shell, and a -6 mm neck adapter.  Brief Clinical Note:   The patient is a 77 year old female who sustained above-noted injury yesterday when she tripped over a gate in her home and landed on her right hip. X-rays in the emergency room demonstrated the above-noted injury. The patient has been cleared medically and presents at this time for definitive management of the injury.  Procedure:   The patient was brought into the operating room and laid in the supine position. After adequate general endotracheal intubation and anesthesia was obtained, a Foley catheter was placed by the nurse. The patient was repositioned in the left lateral decubitus position and secured using a lateral hip positioner. The right hip and lower extremity were prepped with ChloroPrep solution before being draped sterilely. Preoperative antibiotics were administered. A timeout was performed to verify the appropriate surgical site.    A standard posterior approach to the hip was made through an approximately 4-5 inch incision. The incision was carried down through the subcutaneous tissues to expose the gluteal fascia and proximal end of the iliotibial band. These structures were split the length of the incision and the Charnley self-retaining hip retractor placed. The bursal tissues were swept posteriorly to expose the short external rotators. The anterior  border of the piriformis tendon was identified and this plane developed down through the capsule to enter the joint. Abundant fracture hematoma was suctioned. A flap of tissue was elevated off the posterior aspect of the femoral neck and greater trochanter and retracted posteriorly. This flap included the piriformis tendon, the short external rotators, and the posterior capsule. The femoral head was removed in its entirety, then taken to the back table where it was measured and found to be optimally replicated by a 45 mm head. The appropriate trial head was inserted and found to demonstrate an excellent suction fit.   Attention was directed to the femoral side. The femoral neck was recut 10-12 mm above the lesser trochanter using an oscillating saw. The piriformis fossa was debrided of soft tissues before the intramedullary canal was accessed through this point using a triple step reamer. The canal was reamed sequentially beginning with a #7 tapered reamer and progressing to a #12 tapered reamer. This provided excellent circumferential chatter. A box osteotome was used to establish version before the canal was broached sequentially beginning with a #7 broach and progressing to a #12 broach. This was left in place and several trial reductions performed. The permanent #12 laterally offset reduced proximal profile femoral stem was impacted into place. A repeat trial reduction was performed using the -6 mm neck length. The -6 mm neck length demonstrated excellent stability both in extension and external rotation as well as with flexion to 90 and internal rotation beyond 70. It also was  stable in the position of sleep. The 45 mm outer diameter shell with the -6 mm neck adapter construct was put together on the back table before being impacted onto the stem of the femoral component. The Morse taper locking mechanism was verified using manual distraction before the head was relocated and the hip placed through a range  of motion with the findings as described above.  The wound was copiously irrigated with sterile saline solution via the jet lavage system before the peri-incisional and pericapsular tissues were injected with 30 cc of 0.5% Sensorcaine with epinephrine and 20 cc of Exparel diluted out to 60 cc with normal saline to help with postoperative analgesia. The posterior flap was reapproximated to the posterior aspect of the greater trochanter using #2 Tycron interrupted sutures placed through drill holes. The iliotibial band was reapproximated using #1 Vicryl interrupted sutures before the gluteal fascia was closed using a running #0 Vicryl suture. At this point, 1 g of transexemic acid in 10 cc of normal saline was injected into the joint to help reduce postoperative bleeding. The subcutaneous tissues were closed in several layers using 2-0 Vicryl interrupted sutures before the skin was closed using staples. A sterile occlusive dressing was applied to the wound . The patient then was rolled back into the supine position on the hospital bed before being awakened, extubated, and returned to the recovery room in satisfactory condition after tolerating the procedure well.

## 2022-08-16 NOTE — Plan of Care (Signed)
  Problem: Pain Managment: Goal: General experience of comfort will improve Outcome: Progressing   Problem: Safety: Goal: Ability to remain free from injury will improve Outcome: Progressing   Problem: Skin Integrity: Goal: Risk for impaired skin integrity will decrease Outcome: Progressing   Problem: Activity: Goal: Ability to ambulate and perform ADLs will improve Outcome: Progressing   Problem: Pain Management: Goal: Pain level will decrease Outcome: Progressing

## 2022-08-16 NOTE — Anesthesia Procedure Notes (Signed)
Procedure Name: Intubation Date/Time: 08/16/2022 3:11 PM  Performed by: Otho Perl, CRNAPre-anesthesia Checklist: Patient identified Patient Re-evaluated:Patient Re-evaluated prior to induction Oxygen Delivery Method: Circle system utilized Preoxygenation: Pre-oxygenation with 100% oxygen Induction Type: IV induction Ventilation: Mask ventilation without difficulty Laryngoscope Size: McGraph and 4 Grade View: Grade I Tube type: Oral Tube size: 7.0 mm Number of attempts: 1 Airway Equipment and Method: Stylet Placement Confirmation: ETT inserted through vocal cords under direct vision, positive ETCO2 and breath sounds checked- equal and bilateral Secured at: 20 cm Tube secured with: Tape Dental Injury: Teeth and Oropharynx as per pre-operative assessment

## 2022-08-16 NOTE — Anesthesia Postprocedure Evaluation (Signed)
Anesthesia Post Note  Patient: Cindy Moss  Procedure(s) Performed: ARTHROPLASTY BIPOLAR HIP (HEMIARTHROPLASTY) (Right: Hip)  Patient location during evaluation: PACU Anesthesia Type: General Level of consciousness: awake and alert Pain management: pain level controlled Vital Signs Assessment: post-procedure vital signs reviewed and stable Respiratory status: spontaneous breathing, nonlabored ventilation, respiratory function stable and patient connected to nasal cannula oxygen Cardiovascular status: blood pressure returned to baseline and stable Postop Assessment: no apparent nausea or vomiting Anesthetic complications: no  No notable events documented.   Last Vitals:  Vitals:   08/16/22 1754 08/16/22 1954  BP: (!) 144/88 133/64  Pulse: 67 62  Resp: 16 15  Temp: 37 C 36.6 C  SpO2: 98% 94%    Last Pain:  Vitals:   08/16/22 1730  TempSrc:   PainSc: 0-No pain                 Dimas Millin

## 2022-08-17 ENCOUNTER — Encounter: Payer: Self-pay | Admitting: Surgery

## 2022-08-17 LAB — CBC
HCT: 32.7 % — ABNORMAL LOW (ref 36.0–46.0)
Hemoglobin: 10.6 g/dL — ABNORMAL LOW (ref 12.0–15.0)
MCH: 28.6 pg (ref 26.0–34.0)
MCHC: 32.4 g/dL (ref 30.0–36.0)
MCV: 88.4 fL (ref 80.0–100.0)
Platelets: 178 10*3/uL (ref 150–400)
RBC: 3.7 MIL/uL — ABNORMAL LOW (ref 3.87–5.11)
RDW: 13.7 % (ref 11.5–15.5)
WBC: 6.8 10*3/uL (ref 4.0–10.5)
nRBC: 0 % (ref 0.0–0.2)

## 2022-08-17 LAB — BASIC METABOLIC PANEL
Anion gap: 8 (ref 5–15)
BUN: 18 mg/dL (ref 8–23)
CO2: 21 mmol/L — ABNORMAL LOW (ref 22–32)
Calcium: 8.4 mg/dL — ABNORMAL LOW (ref 8.9–10.3)
Chloride: 109 mmol/L (ref 98–111)
Creatinine, Ser: 0.7 mg/dL (ref 0.44–1.00)
GFR, Estimated: >60 mL/min (ref >60)
Glucose, Bld: 184 mg/dL — ABNORMAL HIGH (ref 70–99)
Potassium: 3.9 mmol/L (ref 3.5–5.1)
Sodium: 138 mmol/L (ref 135–145)

## 2022-08-17 MED ORDER — ACETAMINOPHEN 500 MG PO TABS
1000.0000 mg | ORAL_TABLET | Freq: Three times a day (TID) | ORAL | Status: DC
  Administered 2022-08-17 – 2022-08-18 (×3): 1000 mg via ORAL
  Filled 2022-08-17 (×3): qty 2

## 2022-08-17 MED ORDER — TRAMADOL HCL 50 MG PO TABS: 50.0000 mg | ORAL_TABLET | Freq: Four times a day (QID) | ORAL | 0 refills | Status: DC | PRN

## 2022-08-17 MED ORDER — MORPHINE SULFATE (PF) 2 MG/ML IV SOLN: 2.0000 mg | INTRAVENOUS | Status: DC | PRN

## 2022-08-17 MED ORDER — POLYVINYL ALCOHOL 1.4 % OP SOLN: 1.0000 [drp] | OPHTHALMIC | Status: DC | PRN

## 2022-08-17 MED ORDER — ACETAMINOPHEN 500 MG PO TABS: 1000.0000 mg | ORAL_TABLET | Freq: Four times a day (QID) | ORAL | 0 refills | Status: DC | PRN

## 2022-08-17 MED ORDER — ENOXAPARIN SODIUM 40 MG/0.4ML IJ SOSY: 40.0000 mg | PREFILLED_SYRINGE | INTRAMUSCULAR | 0 refills | Status: DC

## 2022-08-17 MED ORDER — TRAMADOL HCL 50 MG PO TABS
50.0000 mg | ORAL_TABLET | Freq: Four times a day (QID) | ORAL | Status: DC | PRN
  Administered 2022-08-17: 50 mg via ORAL
  Filled 2022-08-17: qty 1

## 2022-08-17 NOTE — Evaluation (Signed)
Physical Therapy Evaluation Patient Details Name: Cindy Moss MRN: [id number] DOB: [birth year] Today's Date: 08/17/2022  History of Present Illness  Patient is a 77 year old female with mechanical fall and presenting with right femoral neck fracture. S/p right hip unipolar hemiarthroplasty   Clinical Impression  Patient is agreeable to PT and was already seated in the recliner chair. She typically ambulates with a rollator and has other DME in place at home. She lives with her spouse and has a history of falls per her report.  Gait training initiated with cues for rolling walker and BLE sequencing. The patient has right knee buckling multiple times with ambulation, requiring moderate assistance and cues for safety. The patient also required physical assistance for standing from the chair and to maintain standing balance with rolling walker. Anticipate the patient will continue to require physical assistance with mobility at discharge, with frequent or constant supervision/assistance recommended at this time.The patient expressed that she wants to return home with home health and family support and is not interested in rehab placement at this time. PT will continue to follow to maximize independence and decrease caregiver burden.     Recommendations for follow up therapy are one component of a multi-disciplinary discharge planning process, led by the attending physician.  Recommendations may be updated based on patient status, additional functional criteria and insurance authorization.  Follow Up Recommendations Skilled nursing-short term rehab (<3 hours/day) Can patient physically be transported by private vehicle: No    Assistance Recommended at Discharge Frequent or constant Supervision/Assistance  Patient can return home with the following  A lot of help with walking and/or transfers;A little help with bathing/dressing/bathroom;Help with stairs or ramp for entrance;Assist for  transportation;Assistance with cooking/housework    Equipment Recommendations Rolling walker (2 wheels)  Recommendations for Other Services       Functional Status Assessment Patient has had a recent decline in their functional status and demonstrates the ability to make significant improvements in function in a reasonable and predictable amount of time.     Precautions / Restrictions Precautions Precautions: Posterior Hip;Fall Restrictions Weight Bearing Restrictions: Yes RLE Weight Bearing: Weight bearing as tolerated      Mobility  Bed Mobility               General bed mobility comments: not assessed as patient sitting up on arrival to room and post session    Transfers Overall transfer level: Needs assistance Equipment used: Rolling walker (2 wheels) Transfers: Sit to/from Stand Sit to Stand: Mod assist           General transfer comment: lifting and lowering assistance provided. verbal cues for hand placement    Ambulation/Gait Ambulation/Gait assistance: Mod assist, Min assist Gait Distance (Feet): 30 Feet Assistive device: Rolling walker (2 wheels) Gait Pattern/deviations: Step-to pattern, Knees buckling Gait velocity: decreased     General Gait Details: R knee buckled multiple times with walking. patient required moderate assistance for steadying and cues for safety during the knee buckling episodes. otherwise, Min A provided for steadying. cues for sequencing of rolling walker and BLE with carry over demonstrated  Stairs            Wheelchair Mobility    Modified Rankin (Stroke Patients Only)       Balance Overall balance assessment: Needs assistance Sitting-balance support: Feet supported Sitting balance-Leahy Scale: Good     Standing balance support: Bilateral upper extremity supported Standing balance-Leahy Scale: Poor Standing balance comment: using rolling walker for UE  support in standing, external support required from  therapist                             Pertinent Vitals/Pain Pain Assessment Pain Assessment: Faces Pain Score: 3  Pain Location: R hip Pain Descriptors / Indicators: Sore Pain Intervention(s): Limited activity within patient's tolerance, Monitored during session, Repositioned, Ice applied    Home Living Family/patient expects to be discharged to:: Private residence Living Arrangements: Spouse/significant other Available Help at Discharge: Family Type of Home: House Home Access: Ramped entrance       Home Layout: One level Home Equipment: Health and safety inspector (4 wheels);Shower seat;BSC/3in1      Prior Function Prior Level of Function : Independent/Modified Independent;History of Falls (last six months)             Mobility Comments: patient reports she has fallen in the past. she usually walks with a rollator ADLs Comments: spouse provides supervision with assistance available if needed. typically Mod I     Hand Dominance        Extremity/Trunk Assessment   Upper Extremity Assessment Upper Extremity Assessment: Overall WFL for tasks assessed    Lower Extremity Assessment Lower Extremity Assessment: RLE deficits/detail RLE Deficits / Details: knee buckling intermittently with weight bearing. unable to complete full LAQ, dorsiflexion/plantarflexion AROM WFL RLE Sensation: WNL       Communication   Communication: No difficulties  Cognition Arousal/Alertness: Awake/alert Behavior During Therapy: WFL for tasks assessed/performed Overall Cognitive Status: Within Functional Limits for tasks assessed                                          General Comments General comments (skin integrity, edema, etc.): reviewed posterior hip precuations. will provide written handout/home exercise program during next session    Exercises     Assessment/Plan    PT Assessment Patient needs continued PT services  PT Problem List Decreased  range of motion;Decreased strength;Decreased activity tolerance;Decreased balance;Decreased mobility;Decreased safety awareness;Decreased knowledge of precautions       PT Treatment Interventions DME instruction;Gait training;Stair training;Functional mobility training;Therapeutic activities;Therapeutic exercise;Balance training;Neuromuscular re-education;Cognitive remediation;Patient/family education;Wheelchair mobility training    PT Goals (Current goals can be found in the Care Plan section)  Acute Rehab PT Goals Patient Stated Goal: to go home with home health PT PT Goal Formulation: With patient Time For Goal Achievement: 08/31/22 Potential to Achieve Goals: Good    Frequency BID     Co-evaluation               AM-PAC PT "6 Clicks" Mobility  Outcome Measure Help needed turning from your back to your side while in a flat bed without using bedrails?: A Little Help needed moving from lying on your back to sitting on the side of a flat bed without using bedrails?: A Little Help needed moving to and from a bed to a chair (including a wheelchair)?: A Lot Help needed standing up from a chair using your arms (e.g., wheelchair or bedside chair)?: A Lot Help needed to walk in hospital room?: A Lot Help needed climbing 3-5 steps with a railing? : A Lot 6 Click Score: 14    End of Session Equipment Utilized During Treatment: Gait belt Activity Tolerance: Patient tolerated treatment well Patient left: in chair;with call bell/phone within reach;with chair alarm set Nurse Communication: Mobility status (  knee buckling) PT Visit Diagnosis: Difficulty in walking, not elsewhere classified (R26.2);Muscle weakness (generalized) (M62.81)    Time: [id number] PT Time Calculation (min) (ACUTE ONLY): 22 min   Charges:   PT Evaluation $PT Eval Low Complexity: 1 Low PT Treatments $Gait Training: 8-22 mins       Minna Merritts, PT, MPT   Percell Locus 08/17/2022, 11:31 AM

## 2022-08-17 NOTE — Progress Notes (Signed)
Physical Therapy Treatment Patient Details Name: MILAYNA AMERSON MRN: [id number] DOB: [birth year] Today's Date: 08/17/2022   History of Present Illness Patient is a 77 year old female with mechanical fall and presenting with right femoral neck fracture. S/p right hip unipolar hemiarthroplasty    PT Comments    Patient seen for second session. She had a knee immobilizer in place on the right leg which was used during ambulation. Less frequency of knee buckling noted and activity tolerance increased to walking 114f with rolling walker. However, patient does have loss of balance with walking that required Min A to correct to midline and distracted easily, requiring cues for attention to task. She also continues to require assistance for transfers and for standing balance using rolling walker for support. Reviewed all hip precautions again and issued home exercise program. Continue to anticipate the need for frequent or constant supervision/assistance at discharge. PT will follow up tomorrow in efforts to maximize independence and decrease caregiver burden.   Recommendations for follow up therapy are one component of a multi-disciplinary discharge planning process, led by the attending physician.  Recommendations may be updated based on patient status, additional functional criteria and insurance authorization.  Follow Up Recommendations  Skilled nursing-short term rehab (<3 hours/day) Can patient physically be transported by private vehicle: No   Assistance Recommended at Discharge Frequent or constant Supervision/Assistance  Patient can return home with the following A lot of help with walking and/or transfers;A little help with bathing/dressing/bathroom;Help with stairs or ramp for entrance;Assist for transportation;Assistance with cooking/housework   Equipment Recommendations  Rolling walker (2 wheels)    Recommendations for Other Services       Precautions / Restrictions  Precautions Precautions: Posterior Hip;Fall Precaution Booklet Issued: Yes (comment) Required Braces or Orthoses:  (KI used out of precuation due to knee buckling with walking) Restrictions Weight Bearing Restrictions: Yes RLE Weight Bearing: Weight bearing as tolerated     Mobility  Bed Mobility               General bed mobility comments: not assessed as patient sitting up on arrival to room and post session    Transfers Overall transfer level: Needs assistance Equipment used: Rolling walker (2 wheels) Transfers: Sit to/from Stand Sit to Stand: Mod assist           General transfer comment: lifting and lowering assistance provided. verbal cues for hand placement    Ambulation/Gait Ambulation/Gait assistance: Min assist Gait Distance (Feet): 110 Feet Assistive device: Rolling walker (2 wheels) Gait Pattern/deviations: Step-to pattern, Step-through pattern, Narrow base of support, Drifts right/left Gait velocity: decreased     General Gait Details: knee immobilizer used for R leg (was in place on arrival to room), out of precuation after buckling multiple times with earlier ambulation. noted the knee buckle at least once with walking, however patient seems to have limited to no awareness of this. she is distracted with ambulation and required cues for attention to task. she had one loss of balance to the left (while distracted/talking/head turns) that required assistance to return to midline.   Stairs             Wheelchair Mobility    Modified Rankin (Stroke Patients Only)       Balance Overall balance assessment: Needs assistance Sitting-balance support: Feet supported Sitting balance-Leahy Scale: Good     Standing balance support: Bilateral upper extremity supported Standing balance-Leahy Scale: Poor Standing balance comment: loss of balance x 1 with walking, external support  required                            Cognition  Arousal/Alertness: Awake/alert Behavior During Therapy: WFL for tasks assessed/performed Overall Cognitive Status: Within Functional Limits for tasks assessed                                          Exercises Total Joint Exercises Quad Sets: AROM, Strengthening, Right, 5 reps, Seated Other Exercises Other Exercises: patient has difficulty with following direction for exercise and needs demonstration and repetition    General Comments General comments (skin integrity, edema, etc.): handout provided for posterior hip precuations and therapeutic exercises. reviewed all precuations again with the patient      Pertinent Vitals/Pain Pain Assessment Pain Assessment: 0-10 Pain Score: 3  Pain Location: R hip Pain Descriptors / Indicators: Sore Pain Intervention(s): Limited activity within patient's tolerance, Monitored during session, Repositioned, Ice applied    Home Living                          Prior Function            PT Goals (current goals can now be found in the care plan section) Acute Rehab PT Goals Patient Stated Goal: to go home with home health PT PT Goal Formulation: With patient Time For Goal Achievement: 08/31/22 Potential to Achieve Goals: Good Progress towards PT goals: Progressing toward goals    Frequency    BID      PT Plan Current plan remains appropriate    Co-evaluation              AM-PAC PT "6 Clicks" Mobility   Outcome Measure  Help needed turning from your back to your side while in a flat bed without using bedrails?: A Little Help needed moving from lying on your back to sitting on the side of a flat bed without using bedrails?: A Little Help needed moving to and from a bed to a chair (including a wheelchair)?: A Lot Help needed standing up from a chair using your arms (e.g., wheelchair or bedside chair)?: A Lot Help needed to walk in hospital room?: A Lot Help needed climbing 3-5 steps with a railing?  : A Lot 6 Click Score: 14    End of Session Equipment Utilized During Treatment: Gait belt Activity Tolerance: Patient tolerated treatment well Patient left: in chair;with call bell/phone within reach;with chair alarm set Nurse Communication: Mobility status PT Visit Diagnosis: Difficulty in walking, not elsewhere classified (R26.2);Muscle weakness (generalized) (M62.81)     Time: [id number] PT Time Calculation (min) (ACUTE ONLY): 29 min  Charges:  $Gait Training: 8-22 mins $Therapeutic Activity: 8-22 mins                     Minna Merritts, PT, MPT   Percell Locus 08/17/2022, 3:21 PM

## 2022-08-17 NOTE — Progress Notes (Addendum)
  Progress Note   Patient: Cindy Moss [id number] DOB: 08-30-1945 DOA: 08/15/2022     2 DOS: the patient was seen and examined on 08/17/2022   Brief hospital course: Cindy Moss is a 77 year old female with hypertension, hypothyroid, anxiety, history of breast cancer on Aromasin, who presented on 08/15/2022 for evaluation after having a fall with subsequent right hip pain and inability to bear weight.  The fall was purely mechanical in nature when she tried to step over a pet gate.  Evaluation in the ED revealed a varus displaced right femoral neck fracture.  She was admitted to the hospital with orthopedic surgery consulted.  Assessment and Plan: * Closed displaced fracture of right femoral neck ([place]) Mgmt per Orthopedic surgery, Dr. Roland Rack Plan for surgery today. 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  She reports hip is sore but not severely painful.  Feels she did okay with PT getting to chair and hopes to go home.  She is not sure she would be agreeable to rehab, states her husband is home with her and can help.  No other acute complaints.   Physical Exam: Vitals:   08/16/22 1954 08/16/22 2323 08/17/22 0349 08/17/22 0810  BP: 133/64 126/65 134/69 139/81  Pulse: 62 71 71 75  Resp: '15 15 15 16  '$ Temp: 97.8 F (36.6 C) 97.7 F (36.5 C) 97.9 F (36.6 C) 98 F (36.7 C)  TempSrc:      SpO2: 94% 95% 98% 97%  Weight:      Height:       General exam: awake, alert no acute distress HEENT: moist mucus membranes, hearing grossly normal  Respiratory  system: CTAB, no wheezes, rales or rhonchi, normal respiratory effort. Cardiovascular system: normal S1/S2, RRR, no pedal edema.   Gastrointestinal system: soft, NT, ND, +bowel sounds. Central nervous system: grossly non-focal, normal speech, A&Ox3 Extremities: no edema, normal tone Skin: dry, intact, normal temperature Psychiatry: normal mood and affect, judgment and insight seem intact  Data Reviewed:  Notable labs ---   Hbg 10.6 otherwise normal CBC.  Glucose 184, bicarb 21, Ca 8.4 otherwise normal BMP  Family Communication: None present, will attempt to call.  Disposition: Status is: Inpatient Remains inpatient appropriate because: needs SNF/rehab placement   Planned Discharge Destination: Home with [place] vs SNF    Time spent: 36 minutes  Author: Ezekiel Slocumb, DO 08/17/2022 1:25 PM  For on call review [email or web address].

## 2022-08-17 NOTE — TOC Progression Note (Signed)
Transition of Care [facility]) - Progression Note    Patient Details  Name: Cindy Moss MRN: [id number] Date of Birth: 02/07/1946  Transition of Care [facility]) CM/SW [place], RN Phone Number: 08/17/2022, 2:58 PM  Clinical Narrative:       Expected Discharge Plan: [place] Barriers to Discharge: No Barriers Identified  Expected Discharge Plan and Services       Living arrangements for the past 2 months: Single Family Home                                       Social Determinants of Health (SDOH) Interventions SDOH Screenings   Food Insecurity: No Food Insecurity (08/15/2022)  Housing: Low Risk  (08/15/2022)  Transportation Needs: No Transportation Needs (08/15/2022)  Utilities: Not At Risk (08/15/2022)  Tobacco Use: Medium Risk (08/17/2022)    Readmission Risk Interventions     No data to display

## 2022-08-17 NOTE — Plan of Care (Signed)
  Problem: Health Behavior/Discharge Planning: Goal: Ability to manage health-related needs will improve Outcome: Progressing   Problem: Clinical Measurements: Goal: Diagnostic test results will improve Outcome: Progressing   Problem: Activity: Goal: Risk for activity intolerance will decrease Outcome: Progressing   Problem: Nutrition: Goal: Adequate nutrition will be maintained Outcome: Progressing   Problem: Elimination: Goal: Will not experience complications related to bowel motility Outcome: Progressing   Problem: Pain Managment: Goal: General experience of comfort will improve Outcome: Progressing

## 2022-08-17 NOTE — TOC Progression Note (Signed)
Transition of Care [facility]) - Progression Note    Patient Details  Name: Cindy Moss MRN: [id number] Date of Birth: 01/27/1946  Transition of Care [facility]) CM/SW [place], RN Phone Number: 08/17/2022, 3:44 PM  Clinical Narrative:     The patient is set up for Home health thru [place] for PR, OT, Aide and RN, she has DME at home her husbnd will provide transportation She refuses to go to [place] and said she will be better at home, I provided her with [facility] information and encouraged her to call to see if they will cover PCS services, I attempted to get it set up with [facility] but they did not have her information and stated that she would have to call [facility]  Expected Discharge Plan: [place] Barriers to Discharge: No Barriers Identified  Expected Discharge Plan and Services       Living arrangements for the past 2 months: Single Family Home                 DME Arranged: N/A DME Agency: NA       HH Arranged: RN, PT, OT, Nurse's Aide HH Agency: [place] Date [place]: 08/17/22 Time [place]: 1543 Representative spoke with at [place]: [place]   Social Determinants of Health ([place]) Interventions SDOH Screenings   Food Insecurity: No Food Insecurity (08/15/2022)  Housing: Low Risk  (08/15/2022)  Transportation Needs: No Transportation Needs (08/15/2022)  Utilities: Not At Risk (08/15/2022)  Tobacco Use: Medium Risk (08/17/2022)    Readmission Risk Interventions     No data to display

## 2022-08-17 NOTE — Discharge Instructions (Signed)
Instructions after Hip Replacement     J. Dorien Chihuahua, M.D.  Raquel Ahonesty Woodfin, PA-C     Dept. of [facility]  [address] [place], [place]  [address]  Phone: [phone]   Fax: [phone]    DIET: Drink plenty of non-alcoholic fluids. Resume your normal diet. Include foods high in fiber.  ACTIVITY:  You may use crutches or a walker with weight-bearing as tolerated, unless instructed otherwise. You may be weaned off of the walker or crutches by your Physical Therapist.  Do NOT reach below the level of your knees or cross your legs until allowed.    Continue doing gentle exercises. Exercising will reduce the pain and swelling, increase motion, and prevent muscle weakness.   Please continue to use the TED compression stockings for 6 weeks. You may remove the stockings at night, but should reapply them in the morning. Do not drive or operate any equipment until instructed.  WOUND CARE:  Continue to use ice packs periodically to reduce pain and swelling. Keep the incision clean and dry. You may bathe or shower after the staples are removed at the first office visit following surgery.  MEDICATIONS: You may resume your regular medications. Please take the pain medication as prescribed on the medication. Do not take pain medication on an empty stomach. You have been given a prescription for a blood thinner to prevent blood clots. Please take the medication as instructed. (NOTE: After completing a 2 week course of Lovenox, take one Enteric-coated aspirin once a day.) Pain medications and iron supplements can cause constipation. Use a stool softener (Senokot or Colace) on a daily basis and a laxative (dulcolax or miralax) as needed. Do not drive or drink alcoholic beverages when taking pain medications.  CALL THE OFFICE FOR: Temperature above 101 degrees Excessive bleeding or drainage on the dressing. Excessive swelling, coldness, or  paleness of the toes. Persistent nausea and vomiting.  FOLLOW-UP:  You should have an appointment to return to the office in 2 weeks after surgery. Arrangements have been made for continuation of Physical Therapy (either home therapy or outpatient therapy).

## 2022-08-17 NOTE — Progress Notes (Signed)
  Subjective: 1 Day Post-Op Procedure(s) (LRB): ARTHROPLASTY BIPOLAR HIP (HEMIARTHROPLASTY) (Right) Patient reports pain as mild.   Patient is well, and has had no acute complaints or problems PT and care management to assist with discharge planning. Negative for chest pain and shortness of breath Fever: no Gastrointestinal:Negative for nausea and vomiting Patient states she is passing gas this morning.  Objective: Vital signs in last 24 hours: Temp:  [97.7 F (36.5 C)-99.4 F (37.4 C)] 98 F (36.7 C) (03/07 0810) Pulse Rate:  [62-78] 75 (03/07 0810) Resp:  [14-18] 16 (03/07 0810) BP: (126-144)/(62-88) 139/81 (03/07 0810) SpO2:  [94 %-98 %] 97 % (03/07 0810) Weight:  [53.1 kg] 53.1 kg (03/06 1327)  Intake/Output from previous day:  Intake/Output Summary (Last 24 hours) at 08/17/2022 1008 Last data filed at 08/17/2022 0600 Gross per 24 hour  Intake 1208.32 ml  Output 1375 ml  Net -166.68 ml    Intake/Output this shift: No intake/output data recorded.  Labs: Recent Labs    08/15/22 1156 08/16/22 0535 08/17/22 0330  HGB 12.0 11.9* 10.6*   Recent Labs    08/16/22 0535 08/17/22 0330  WBC 8.2 6.8  RBC 4.23 3.70*  HCT 37.1 32.7*  PLT 181 178   Recent Labs    08/16/22 0535 08/17/22 0330  NA 139 138  K 3.6 3.9  CL 108 109  CO2 23 21*  BUN 18 18  CREATININE 0.64 0.70  GLUCOSE 112* 184*  CALCIUM 9.0 8.4*   No results for input(s): "LABPT", "INR" in the last 72 hours.   EXAM General - Patient is Alert, Appropriate, and Oriented Extremity - ABD soft Neurovascular intact Dorsiflexion/Plantar flexion intact Incision: scant drainage No cellulitis present Compartment soft Dressing/Incision - Minimal bloody drainage Motor Function - intact, moving foot and toes well on exam.  Abdomen soft with intact bowel sounds. Negative homans test to the right leg.  Past Medical History:  Diagnosis Date   Cancer ([place])    breast   Hypertension    Hypokalemia     Thyroid disease     Assessment/Plan: 1 Day Post-Op Procedure(s) (LRB): ARTHROPLASTY BIPOLAR HIP (HEMIARTHROPLASTY) (Right) Principal Problem:   Closed displaced fracture of right femoral neck ([facility]) Active Problems:   Essential hypertension   Hypothyroidism   History of right breast cancer  Estimated body mass index is 22.11 kg/m as calculated from the following:   Height as of this encounter: '5\' 1"'$  (1.549 m).   Weight as of this encounter: 53.1 kg. Advance diet Up with therapy D/C IV fluids when tolerating po intake.  Labs and vitals reviewed, Hg 10.6.  WBC 6.8. Up with therapy today. Patient is passing gas, continue to work on a BM. Care management to assist with discharge planning.   DVT Prophylaxis - Lovenox, TED hose, and SCDs Weight-Bearing as tolerated to right leg  J. Cameron Proud, PA-C [facility] 08/17/2022, 10:08 AM

## 2022-08-18 LAB — CBC
HCT: 30.4 % — ABNORMAL LOW (ref 36.0–46.0)
Hemoglobin: 9.7 g/dL — ABNORMAL LOW (ref 12.0–15.0)
MCH: 28.5 pg (ref 26.0–34.0)
MCHC: 31.9 g/dL (ref 30.0–36.0)
MCV: 89.4 fL (ref 80.0–100.0)
Platelets: 197 10*3/uL (ref 150–400)
RBC: 3.4 MIL/uL — ABNORMAL LOW (ref 3.87–5.11)
RDW: 14.1 % (ref 11.5–15.5)
WBC: 8.3 10*3/uL (ref 4.0–10.5)
nRBC: 0 % (ref 0.0–0.2)

## 2022-08-18 LAB — SURGICAL PATHOLOGY

## 2022-08-18 MED ORDER — APIXABAN 2.5 MG PO TABS: 2.5000 mg | ORAL_TABLET | Freq: Two times a day (BID) | ORAL | 0 refills | Status: DC

## 2022-08-18 MED ORDER — DOCUSATE SODIUM 100 MG PO CAPS: 100.0000 mg | ORAL_CAPSULE | Freq: Two times a day (BID) | ORAL | 0 refills | Status: AC

## 2022-08-18 MED ORDER — ACETAMINOPHEN 500 MG PO TABS: 1000.0000 mg | ORAL_TABLET | Freq: Four times a day (QID) | ORAL | 0 refills | Status: DC | PRN

## 2022-08-18 MED ORDER — TRAMADOL HCL 50 MG PO TABS: 50.0000 mg | ORAL_TABLET | Freq: Four times a day (QID) | ORAL | 0 refills | Status: DC | PRN

## 2022-08-18 MED ORDER — ASPIRIN 325 MG PO TBEC: 325.0000 mg | DELAYED_RELEASE_TABLET | Freq: Two times a day (BID) | ORAL | 0 refills | Status: AC

## 2022-08-18 NOTE — Progress Notes (Signed)
Author has returned to pt's room 2 x since AM session. One time to educate pt on proper application of knee immobilizer. 2nd time to educate pt/pt's spouse on not using knee immobilizer endless she feels she needs it. She ambulated this afternoon without immobilizer donned without buckling or LOB. Author encouraged pt to discontinue knee immobilizer. Pt/spouse state understanding. RN in room giving DC instructions at conclusion of this session.

## 2022-08-18 NOTE — Plan of Care (Signed)
  Problem: Health Behavior/Discharge Planning: Goal: Ability to manage health-related needs will improve Outcome: Progressing   Problem: Clinical Measurements: Goal: Will remain free from infection Outcome: Progressing Goal: Diagnostic test results will improve Outcome: Progressing Goal: Cardiovascular complication will be avoided Outcome: Progressing   Problem: Nutrition: Goal: Adequate nutrition will be maintained Outcome: Progressing   Problem: Elimination: Goal: Will not experience complications related to bowel motility Outcome: Progressing Goal: Will not experience complications related to urinary retention Outcome: Progressing   Problem: Pain Managment: Goal: General experience of comfort will improve Outcome: Progressing

## 2022-08-18 NOTE — Discharge Summary (Signed)
Physician Discharge Summary   Patient: Cindy Moss MRN: [id number] DOB: 1945/10/17  Admit date:     08/15/2022  Discharge date: 08/18/22  Discharge Physician: Ezekiel Slocumb   PCP: Dianne Dun, MD   Recommendations at discharge:   Follow up with [place] in 10-14 days Follow up with Primary Care in 1-2 week Repeat CBC, BMP in 1-2 weeks (follow post-op anemia) Patient on Aspirin 325 mg BID x 14 days for DVT prophylaxis. Aspirin 81 mg to be held and resumed thereafter.   Discharge Diagnoses: Principal Problem:   Closed displaced fracture of right femoral neck (HCC) Active Problems:   Essential hypertension   Hypothyroidism   History of right breast cancer  Resolved Problems:   * No resolved hospital problems. [facility] Course: Ms. Cindy Moss is a 77 year old female with hypertension, hypothyroid, anxiety, history of breast cancer on Aromasin, who presented on 08/15/2022 for evaluation after having a fall with subsequent right hip pain and inability to bear weight.  The fall was purely mechanical in nature when she tried to step over a pet gate.  Evaluation in the ED revealed a varus displaced right femoral neck fracture.  She was admitted to the hospital with orthopedic surgery consulted.  Assessment and Plan: * Closed displaced fracture of right femoral neck ([place]) Mgmt per Orthopedic surgery, Dr. Roland Rack Plan for surgery today. 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 - Continue home exemestane 25 mg daily  - Follow-up with oncologly outpatient  Hypothyroidism Continue levothyroxine   Essential hypertension Continue home clonidine, losartan          Consultants: Orthopedic surgery Procedures  performed: Right hip unipolar hemiarthroplasty 08/16/2022   Disposition: Home health   Diet recommendation:  Regular diet DISCHARGE MEDICATION: Allergies as of 08/18/2022       Reactions   Amlodipine Swelling   Desipramine Hives   Flavoxate Itching   Influenza Vaccines Swelling   Metronidazole Hives   Penicillins Hives   Xanax [alprazolam] Hives   Ace Inhibitors Rash        Medication List     TAKE these medications    acetaminophen 500 MG tablet Commonly known as: TYLENOL Take 2 tablets (1,000 mg total) by mouth every 6 (six) hours as needed for mild pain, fever or headache.   alendronate 70 MG tablet Commonly known as: FOSAMAX Take 70 mg by mouth once a week.   aspirin EC 325 MG tablet Take 1 tablet (325 mg total) by mouth 2 (two) times daily with a meal for 14 days. What changed:  medication strength how much to take when to take this   cloNIDine 0.2 MG tablet Commonly known as: CATAPRES Take by mouth.   docusate sodium 100 MG capsule Commonly known as: COLACE Take 1 capsule (100 mg total) by mouth 2 (two) times daily. Hold if having loose or frequent stools.   exemestane 25 MG tablet Commonly known as: AROMASIN Take 25 mg by mouth daily.   losartan 50 MG tablet Commonly known as: COZAAR Take 1 tablet by mouth daily.   polyethylene glycol powder 17 GM/SCOOP powder Commonly known as: GLYCOLAX/MIRALAX Take by mouth.   Synthroid 75 MCG tablet Generic drug: levothyroxine  Take 75 mcg by mouth daily.   traMADol 50 MG tablet Commonly known as: ULTRAM Take 1-2 tablets (50-100 mg total) by mouth every 6 (six) hours as needed for moderate pain or severe pain.        Follow-up Information     Lattie Corns, PA-C. Call in 14 day(s).   Specialty: Physician Assistant Why: Electa Sniff information: [facility] [address] [phone]                Discharge Exam: Filed Weights   08/15/22 1147 08/16/22  1327  Weight: 51.1 kg 53.1 kg   General exam: awake, alert, no acute distress HEENT: moist mucus membranes, hearing grossly normal  Respiratory system: CTAB, no wheezes, rales or rhonchi, normal respiratory effort. Cardiovascular system: normal S1/S2, RRR,  no pedal edema.   Gastrointestinal system: soft, NT, ND. Central nervous system: A&O x4. no gross focal neurologic deficits, normal speech Extremities: right LE immobilizer brace in place, distal RLE sensation intact and normal temp, no edema, normal tone Skin: dry, intact, normal temperature Psychiatry: normal mood, congruent affect, judgement and insight appear normal   Condition at discharge: stable  The results of significant diagnostics from this hospitalization (including imaging, microbiology, ancillary and laboratory) are listed below for reference.   Imaging Studies: DG HIP UNILAT W OR W/O PELVIS 2-3 VIEWS RIGHT  Result Date: 08/16/2022 CLINICAL DATA:  Status post hip hemiarthroplasty. EXAM: DG HIP (WITH OR WITHOUT PELVIS) 2-3V RIGHT COMPARISON:  Preoperative radiograph yesterday. FINDINGS: Right hip hemiarthroplasty in expected alignment. No periprosthetic lucency or fracture. Recent postsurgical change includes air and edema in the soft tissues. Lateral skin staples in place. Prior left hip arthroplasty is again seen. IMPRESSION: Right hip hemiarthroplasty without immediate postoperative complication. Electronically Signed   By: Keith Rake M.D.   On: 08/16/2022 17:34   DG Chest 1 View  Result Date: 08/15/2022 CLINICAL DATA:  Fall. EXAM: CHEST  1 VIEW COMPARISON:  01/16/2014 FINDINGS: Coarse lung markings are chronic. No focal airspace disease or lung consolidation. Heart size is slightly enlarged and stable. Atherosclerotic calcifications at the aortic arch. Evidence for scarring at both lung apices. The trachea is midline. Surgical clip in the right breast region. IMPRESSION: Chronic lung changes without acute findings.  Electronically Signed   By: Markus Daft M.D.   On: 08/15/2022 12:34   DG Hip Unilat W or Wo Pelvis 2-3 Views Right  Result Date: 08/15/2022 CLINICAL DATA:  Fall. EXAM: DG HIP (WITH OR WITHOUT PELVIS) 2-3V RIGHT COMPARISON:  03/21/2013 FINDINGS: Displaced fracture of the proximal right femur. Fracture appears to be involving the right femoral neck and probably represents a transcervical femoral neck fracture. Superior displacement of the right femoral trochanters. Right femoral head is located. Pelvic bony ring is intact. Left hip arthroplasty is partially imaged but appears to be intact. IMPRESSION: Displaced fracture of the proximal right femur. Fracture appears to be involving the right femoral neck. Electronically Signed   By: Markus Daft M.D.   On: 08/15/2022 12:32    Microbiology: Results for orders placed or performed during the hospital encounter of 08/15/22  Surgical pcr screen     Status: None   Collection Time: 08/15/22  4:28 PM   Specimen: Nasal Mucosa; Nasal Swab  Result Value Ref Range Status   MRSA, PCR NEGATIVE NEGATIVE Final   Staphylococcus aureus NEGATIVE NEGATIVE Final    Comment: (NOTE) The Xpert SA Assay (FDA approved for NASAL specimens in patients 22  years of age and older), is one component of a comprehensive surveillance program. It is not intended to diagnose infection nor to guide or monitor treatment. Performed at [facility], [address] [place], [place] [address]     Labs: CBC: Recent Labs  Lab 08/15/22 1156 08/16/22 0535 08/17/22 0330 08/18/22 0613  WBC 8.9 8.2 6.8 8.3  NEUTROABS 7.3  --   --   --   HGB 12.0 11.9* 10.6* 9.7*  HCT 38.1 37.1 32.7* 30.4*  MCV 89.9 87.7 88.4 89.4  PLT 189 181 178 [id number]   Basic Metabolic Panel: Recent Labs  Lab 08/15/22 1156 08/16/22 0535 08/17/22 0330  NA 140 139 138  K 4.3 3.6 3.9  CL 108 108 109  CO2 24 23 21*  GLUCOSE 111* 112* 184*  BUN '15 18 18  '$ CREATININE 0.67 0.64 0.70  CALCIUM 9.6 9.0  8.4*   Liver Function Tests: No results for input(s): "AST", "ALT", "ALKPHOS", "BILITOT", "PROT", "ALBUMIN" in the last 168 hours. CBG: No results for input(s): "GLUCAP" in the last 168 hours.  Discharge time spent: less than 30 minutes.  Signed: Ezekiel Slocumb, DO [facility] 08/18/2022

## 2022-08-18 NOTE — Progress Notes (Signed)
  Subjective: 2 Days Post-Op Procedure(s) (LRB): ARTHROPLASTY BIPOLAR HIP (HEMIARTHROPLASTY) (Right) Patient reports pain as mild.   Patient is well, and has had no acute complaints or problems Knee immobilizer intact to the right leg. Plan is for discharge home with HHPT and nursing aide. Negative for chest pain and shortness of breath Fever: no Gastrointestinal:Negative for nausea and vomiting Patient states she is passing gas this morning.  Objective: Vital signs in last 24 hours: Temp:  [98 F (36.7 C)-98.6 F (37 C)] 98.3 F (36.8 C) (03/07 2314) Pulse Rate:  [68-85] 68 (03/07 2314) Resp:  [16-18] 18 (03/07 2314) BP: (139-155)/(69-81) 139/75 (03/07 2314) SpO2:  [96 %-98 %] 98 % (03/07 2314)  Intake/Output from previous day:  Intake/Output Summary (Last 24 hours) at 08/18/2022 0739 Last data filed at 08/17/2022 1505 Gross per 24 hour  Intake 346.04 ml  Output 200 ml  Net 146.04 ml    Intake/Output this shift: No intake/output data recorded.  Labs: Recent Labs    08/15/22 1156 08/16/22 0535 08/17/22 0330 08/18/22 0613  HGB 12.0 11.9* 10.6* 9.7*   Recent Labs    08/17/22 0330 08/18/22 0613  WBC 6.8 8.3  RBC 3.70* 3.40*  HCT 32.7* 30.4*  PLT 178 197   Recent Labs    08/16/22 0535 08/17/22 0330  NA 139 138  K 3.6 3.9  CL 108 109  CO2 23 21*  BUN 18 18  CREATININE 0.64 0.70  GLUCOSE 112* 184*  CALCIUM 9.0 8.4*   No results for input(s): "LABPT", "INR" in the last 72 hours.   EXAM General - Patient is Alert, Appropriate, and Oriented Extremity - ABD soft Neurovascular intact Dorsiflexion/Plantar flexion intact Incision: scant drainage No cellulitis present Compartment soft Dressing/Incision - Minimal bloody drainage Motor Function - intact, moving foot and toes well on exam.  Abdomen soft with intact bowel sounds. Negative homans test to the right leg.  Past Medical History:  Diagnosis Date   Cancer ([facility])    breast   Hypertension     Hypokalemia    Thyroid disease     Assessment/Plan: 2 Days Post-Op Procedure(s) (LRB): ARTHROPLASTY BIPOLAR HIP (HEMIARTHROPLASTY) (Right) Principal Problem:   Closed displaced fracture of right femoral neck (HCC) Active Problems:   Essential hypertension   Hypothyroidism   History of right breast cancer  Estimated body mass index is 22.11 kg/m as calculated from the following:   Height as of this encounter: 5\' 1"  (1.549 m).   Weight as of this encounter: 53.1 kg. Advance diet Up with therapy D/C IV fluids when tolerating po intake.  Labs and vitals reviewed, Hg 9.7.  WBC 8.3 Up with therapy today.  Performed well with the assistance of a knee immobilizer, walked over 100 feet. Can remove immobilizer while sitting and in bed. Patient is passing gas, continue to work on a BM. Current plan is for discharge home with HHPT.  Following discharge home, continue Lovenox 40mg  daily for 14 days for DVT prophylaxis. Follow-up with [facility] in 10-14 days for staple removal and x-rays of the right hip.  DVT Prophylaxis - Lovenox, TED hose, and SCDs Weight-Bearing as tolerated to right leg  J. Cameron Proud, PA-C [facility] 08/18/2022, 7:39 AM

## 2022-08-18 NOTE — Care Management Important Message (Signed)
Important Message  Patient Details  Name: Cindy Moss MRN: [id number] Date of Birth: [birth year]   Medicare Important Message Given:  Yes     Dannette Barbara 08/18/2022, 10:46 AM

## 2022-08-18 NOTE — Progress Notes (Signed)
Physical Therapy Treatment Patient Details Name: Cindy Moss MRN: [id number] DOB: 1945/08/09 Today's Date: 08/18/2022   History of Present Illness Patient is a 77 year old female with mechanical fall and presenting with right femoral neck fracture. S/p right hip unipolar hemiarthroplasty    PT Comments    Pt was sitting in recliner with RLE knee immobilizer in place however improperly fitting. Author adjusted knee immobilizer to proper fit prior to pt standing and ambulating 200 ft. + tolerated performance of HEP exercises. Pt demonstrated much improved safety overall versus previous date. She is planning to DC home later today with supportive spouse providing 24/7 assistance if needed. Pt will continue to benefit from HHPT at DC to maximize independence and safety with all ADLs.    Recommendations for follow up therapy are one component of a multi-disciplinary discharge planning process, led by the attending physician.  Recommendations may be updated based on patient status, additional functional criteria and insurance authorization.  Follow Up Recommendations  Home health PT     Assistance Recommended at Discharge Frequent or constant Supervision/Assistance  Patient can return home with the following A little help with walking and/or transfers;A little help with bathing/dressing/bathroom;Assistance with cooking/housework;Direct supervision/assist for medications management;Direct supervision/assist for financial management;Assist for transportation;Help with stairs or ramp for entrance   Equipment Recommendations  Other (comment) (Pt reports having all equipment needs met already)       Precautions / Restrictions Precautions Precautions: Posterior Hip;Fall Precaution Booklet Issued: Yes (comment) Restrictions Weight Bearing Restrictions: Yes RLE Weight Bearing: Weight bearing as tolerated     Mobility  Bed Mobility  General bed mobility comments: In recliner pre/post session     Transfers Overall transfer level: Needs assistance Equipment used: Rolling walker (2 wheels) Transfers: Sit to/from Stand Sit to Stand: Supervision  General transfer comment: no physical assistance required however vcs for handplacement and technique    Ambulation/Gait Ambulation/Gait assistance: Supervision Gait Distance (Feet): 200 Feet Assistive device: Rolling walker (2 wheels) Gait Pattern/deviations: Step-to pattern, Antalgic Gait velocity: decreased  General Gait Details: Author adjusted knee immobilizer to proper fit prior to pt standing and ambulating 200 ft. No LOB or safety concerns during gait. Will instruct pt's spouse on proper application of brace/knee immobilizer when he arrives   Stairs Stairs:  (Pt has ramp entry)    Balance Overall balance assessment: Needs assistance Sitting-balance support: Feet supported Sitting balance-Leahy Scale: Good     Standing balance support: Bilateral upper extremity supported Standing balance-Leahy Scale: Fair Standing balance comment: no LOB observed during session however author recommends continued BUE/AD use at DC       Cognition Arousal/Alertness: Awake/alert Behavior During Therapy: WFL for tasks assessed/performed Overall Cognitive Status: Within Functional Limits for tasks assessed    General Comments: Pt is A and O x4.           General Comments General comments (skin integrity, edema, etc.): Reviewed handout and pt performed      Pertinent Vitals/Pain Pain Assessment Pain Assessment: 0-10 Pain Score: 2  Pain Location: R hip Pain Intervention(s): Limited activity within patient's tolerance, Monitored during session, Premedicated before session, Repositioned, Ice applied     PT Goals (current goals can now be found in the care plan section) Acute Rehab PT Goals Patient Stated Goal: to go home with home health PT Progress towards PT goals: Progressing toward goals    Frequency    BID      PT  Plan Discharge plan needs to be updated  AM-PAC PT "6 Clicks" Mobility   Outcome Measure  Help needed turning from your back to your side while in a flat bed without using bedrails?: A Little Help needed moving from lying on your back to sitting on the side of a flat bed without using bedrails?: A Little Help needed moving to and from a bed to a chair (including a wheelchair)?: A Little Help needed standing up from a chair using your arms (e.g., wheelchair or bedside chair)?: A Little Help needed to walk in hospital room?: A Little Help needed climbing 3-5 steps with a railing? : A Little 6 Click Score: 18    End of Session   Activity Tolerance: Patient tolerated treatment well Patient left: in chair;with call bell/phone within reach;with chair alarm set Nurse Communication: Mobility status PT Visit Diagnosis: Difficulty in walking, not elsewhere classified (R26.2);Muscle weakness (generalized) (M62.81)     Time: [id number] PT Time Calculation (min) (ACUTE ONLY): 25 min  Charges:  $Gait Training: 8-22 mins $Therapeutic Exercise: 8-22 mins                     Julaine Fusi PTA 08/18/22, 9:36 AM

## 2022-08-21 ENCOUNTER — Other Ambulatory Visit: Payer: Self-pay | Admitting: Nurse Practitioner

## 2022-08-21 MED ORDER — OXYCODONE-ACETAMINOPHEN 2.5-325 MG PO TABS: 1.0000 | ORAL_TABLET | ORAL | 0 refills | Status: AC | PRN

## 2022-08-21 NOTE — Progress Notes (Signed)
Insurance did not cover [place] reviewed and percocet 2.5 mg ordered

## 2023-01-19 ENCOUNTER — Other Ambulatory Visit (INDEPENDENT_AMBULATORY_CARE_PROVIDER_SITE_OTHER): Payer: Self-pay | Admitting: Vascular Surgery

## 2023-01-19 DIAGNOSIS — M79604 Pain in right leg: Secondary | ICD-10-CM

## 2023-01-20 DIAGNOSIS — I70219 Atherosclerosis of native arteries of extremities with intermittent claudication, unspecified extremity: Secondary | ICD-10-CM | POA: Insufficient documentation

## 2023-01-20 NOTE — Progress Notes (Signed)
MRN : [id number]  Cindy Moss is a 77 y.o. (03-Jan-1946) female who presents with chief complaint of check circulation.  History of Present Illness:   The patient is seen for evaluation of painful lower extremities. Patient notes the pain is variable and not always associated with activity.  The pain is somewhat consistent day to day occurring on most days. The patient notes the pain also occurs with standing and routinely seems worse as the day wears on. The pain has been progressive over the past several years. The patient states these symptoms are causing  a negative impact on quality of life and daily activities which was a factor in the referral.  The patient has a  history of back problems and DJD of the lumbar and sacral spine.   The patient denies rest pain or dangling of an extremity off the side of the bed during the night for relief. No open wounds or sores at this time. No history of DVT or phlebitis. No prior vascular interventions or surgeries.  ABI Rt=1.14 and Lt=1.15 Triphasic bilaterally   No outpatient medications have been marked as taking for the 01/22/23 encounter (Appointment) with Gilda Crease, Latina Craver, MD.    Past Medical History:  Diagnosis Date   Cancer [facility])    breast   Hypertension    Hypokalemia    Thyroid disease     Past Surgical History:  Procedure Laterality Date   CATARACT EXTRACTION Left    HIP ARTHROPLASTY Right 08/16/2022   Procedure: ARTHROPLASTY BIPOLAR HIP (HEMIARTHROPLASTY);  Surgeon: Christena Flake, MD;  Location: [facility] ORS;  Service: Orthopedics;  Laterality: Right;   HIP SURGERY Left     Social History Social History   Tobacco Use   Smoking status: Former    Current packs/day: 1.00    Types: Cigarettes   Smokeless tobacco: Never  Vaping Use   Vaping status: Never Used  Substance Use Topics   Alcohol use: Never   Drug use: Never    Family History Family  History  Problem Relation Age of Onset   Kidney disease Mother    Heart attack Brother    Diabetes Daughter     Allergies  Allergen Reactions   Amlodipine Swelling   Desipramine Hives   Flavoxate Itching   Influenza Vaccines Swelling   Metronidazole Hives   Penicillins Hives   Xanax [Alprazolam] Hives   Ace Inhibitors Rash     REVIEW OF SYSTEMS (Negative unless checked)  Constitutional: [] Weight loss  [] Fever  [] Chills Cardiac: [] Chest pain   [] Chest pressure   [] Palpitations   [] Shortness of breath when laying flat   [] Shortness of breath with exertion. Vascular:  [x] Pain in legs with walking   [] Pain in legs at rest  [] History of DVT   [] Phlebitis   [] Swelling in legs   [] Varicose veins   [] Non-healing ulcers Pulmonary:   [] Uses home oxygen   [] Productive cough   [] Hemoptysis   [] Wheeze  [] COPD   [] Asthma Neurologic:  [] Dizziness   [] Seizures   [] History of stroke   [] History of TIA  [] Aphasia   []   Vissual changes   [] Weakness or numbness in arm   [] Weakness or numbness in leg Musculoskeletal:   [] Joint swelling   [] Joint pain   [] Low back pain Hematologic:  [] Easy bruising  [] Easy bleeding   [] Hypercoagulable state   [] Anemic Gastrointestinal:  [] Diarrhea   [] Vomiting  [] Gastroesophageal reflux/heartburn   [] Difficulty swallowing. Genitourinary:  [] Chronic kidney disease   [] Difficult urination  [] Frequent urination   [] Blood in urine Skin:  [] Rashes   [] Ulcers  Psychological:  [] History of anxiety   []  History of major depression.  Physical Examination  There were no vitals filed for this visit. There is no height or weight on file to calculate BMI. Gen: WD/WN, NAD Head: [place]/AT, No temporalis wasting.  Ear/Nose/Throat: Hearing grossly intact, nares w/o erythema or drainage Eyes: PER, EOMI, sclera nonicteric.  Neck: Supple, no masses.  No bruit or JVD.  Pulmonary:  Good air movement, no audible wheezing, no use of accessory muscles.  Cardiac: RRR, normal S1, S2, no  Murmurs. Vascular:  mild trophic changes, no open wounds Vessel Right Left  Radial Palpable Palpable  PT  Palpable  Palpable  DP Palpable Palpable  Gastrointestinal: soft, non-distended. No guarding/no peritoneal signs.  Musculoskeletal: M/S 5/5 throughout.  No visible deformity.  Neurologic: CN 2-12 intact. Pain and light touch intact in extremities.  Symmetrical.  Speech is fluent. Motor exam as listed above. Psychiatric: Judgment intact, Mood & affect appropriate for pt's clinical situation. Dermatologic: No rashes or ulcers noted.  No changes consistent with cellulitis.   CBC Lab Results  Component Value Date   WBC 8.3 08/18/2022   HGB 9.7 (L) 08/18/2022   HCT 30.4 (L) 08/18/2022   MCV 89.4 08/18/2022   PLT 197 08/18/2022    BMET    Component Value Date/Time   NA 138 08/17/2022 0330   K 3.9 08/17/2022 0330   CL 109 08/17/2022 0330   CO2 21 (L) 08/17/2022 0330   GLUCOSE 184 (H) 08/17/2022 0330   BUN 18 08/17/2022 0330   CREATININE 0.70 08/17/2022 0330   CALCIUM 8.4 (L) 08/17/2022 0330   GFRNONAA >60 08/17/2022 0330   GFRAA >60 08/07/2016 1510   CrCl cannot be calculated (Patient's most recent lab result is older than the maximum 21 days allowed.).  COAG No results found for: "INR", "PROTIME"  Radiology No results found.   Assessment/Plan 1. Atherosclerosis of native artery of both lower extremities with intermittent claudication (HCC) Recommend:  The patient has atypical pain symptoms for vascular disease and on exam I do not find evidence of vascular pathology that would explain the patient's symptoms.  Noninvasive studies do not identify significant vascular problems  I suspect the patient is c/o pseudoclaudication.  Patient should have an evaluation of the LS spine which I defer to the primary service or the Spine service.  The patient should continue walking and begin a more formal exercise program. The patient should continue his antiplatelet therapy and  aggressive treatment of the lipid abnormalities.  Patient will follow-up with me on a PRN basis.  2. Essential hypertension Continue antihypertensive medications as already ordered, these medications have been reviewed and there are no changes at this time.  3. Hypothyroidism, unspecified type Continue hormone replacement as ordered and reviewed, no changes at this time  4. Closed displaced fracture of right femoral neck (HCC) Continue NSAID medications as already ordered, these medications have been reviewed and there are no changes at this time.  Continued activity and therapy was stressed.  Levora Dredge, MD  01/20/2023 2:20 PM

## 2023-01-22 ENCOUNTER — Ambulatory Visit (INDEPENDENT_AMBULATORY_CARE_PROVIDER_SITE_OTHER): Payer: Self-pay

## 2023-01-22 ENCOUNTER — Encounter (INDEPENDENT_AMBULATORY_CARE_PROVIDER_SITE_OTHER): Payer: Self-pay | Admitting: Vascular Surgery

## 2023-01-22 ENCOUNTER — Ambulatory Visit (INDEPENDENT_AMBULATORY_CARE_PROVIDER_SITE_OTHER): Payer: Medicare HMO | Admitting: Vascular Surgery

## 2023-01-22 VITALS — BP 138/73 | HR 59 | Resp 19 | Ht 64.0 in | Wt 110.7 lb

## 2023-01-22 DIAGNOSIS — S72001A Fracture of unspecified part of neck of right femur, initial encounter for closed fracture: Secondary | ICD-10-CM

## 2023-01-22 DIAGNOSIS — E039 Hypothyroidism, unspecified: Secondary | ICD-10-CM | POA: Diagnosis not present

## 2023-01-22 DIAGNOSIS — I70213 Atherosclerosis of native arteries of extremities with intermittent claudication, bilateral legs: Secondary | ICD-10-CM | POA: Diagnosis not present

## 2023-01-22 DIAGNOSIS — M79605 Pain in left leg: Secondary | ICD-10-CM

## 2023-01-22 DIAGNOSIS — M79604 Pain in right leg: Secondary | ICD-10-CM

## 2023-01-22 DIAGNOSIS — I10 Essential (primary) hypertension: Secondary | ICD-10-CM | POA: Diagnosis not present

## 2023-01-25 ENCOUNTER — Encounter (INDEPENDENT_AMBULATORY_CARE_PROVIDER_SITE_OTHER): Payer: Self-pay

## 2023-01-25 ENCOUNTER — Encounter (INDEPENDENT_AMBULATORY_CARE_PROVIDER_SITE_OTHER): Payer: Self-pay | Admitting: Vascular Surgery

## 2023-01-28 ENCOUNTER — Encounter (INDEPENDENT_AMBULATORY_CARE_PROVIDER_SITE_OTHER): Payer: Self-pay | Admitting: Vascular Surgery
